# Patient Record
Sex: Female | Born: 1965
Health system: Southern US, Community
[De-identification: ages and names within clinical notes are randomized; demographics above are authoritative.]

## PROBLEM LIST (undated history)

## (undated) DIAGNOSIS — K279 Peptic ulcer, site unspecified, unspecified as acute or chronic, without hemorrhage or perforation: Secondary | ICD-10-CM

## (undated) DIAGNOSIS — I10 Essential (primary) hypertension: Secondary | ICD-10-CM

## (undated) DIAGNOSIS — K219 Gastro-esophageal reflux disease without esophagitis: Secondary | ICD-10-CM

## (undated) DIAGNOSIS — Z5189 Encounter for other specified aftercare: Secondary | ICD-10-CM

## (undated) DIAGNOSIS — D509 Iron deficiency anemia, unspecified: Principal | ICD-10-CM

## (undated) DIAGNOSIS — N63 Unspecified lump in unspecified breast: Secondary | ICD-10-CM

## (undated) DIAGNOSIS — K275 Chronic or unspecified peptic ulcer, site unspecified, with perforation: Secondary | ICD-10-CM

## (undated) HISTORY — DX: Peptic ulcer, site unspecified, unspecified as acute or chronic, without hemorrhage or perforation: K27.9

## (undated) HISTORY — DX: Iron deficiency anemia, unspecified: D50.9

## (undated) HISTORY — DX: Chronic or unspecified peptic ulcer, site unspecified, with perforation: K27.5

## (undated) HISTORY — DX: Unspecified lump in unspecified breast: N63.0

---

## 2004-06-27 HISTORY — PX: REPAIR OF PERFORATED ULCER: SHX6065

## 2004-07-08 ENCOUNTER — Inpatient Hospital Stay (HOSPITAL_COMMUNITY): Admission: EM | Admit: 2004-07-08 | Discharge: 2004-07-14 | Payer: Self-pay

## 2004-07-08 DIAGNOSIS — K275 Chronic or unspecified peptic ulcer, site unspecified, with perforation: Secondary | ICD-10-CM

## 2004-07-08 HISTORY — DX: Chronic or unspecified peptic ulcer, site unspecified, with perforation: K27.5

## 2006-06-27 DIAGNOSIS — Z5189 Encounter for other specified aftercare: Secondary | ICD-10-CM

## 2006-06-27 HISTORY — DX: Encounter for other specified aftercare: Z51.89

## 2006-11-16 IMAGING — CT CT ABDOMEN W/ CM
1 of 3 series · 14 of 32 positions shown, 19 images · IV contrast (GASTRO & OMNI 300 [ID])
Comparison: none

CLINICAL DATA: Severe centralized abdominal pain with fever and nausea. 
 CT SCAN OF THE ABDOMEN AND WITH INTRAVENOUS CONTRAST, 07/08/04:
TECHNIQUE: Scans were performed following intravenous injection of 100 cc of Omnipaque 300.
 CT ABDOMEN: 
 There is cardiomegaly.  There is some atelectasis at the left lung base.
 There is mild hepatomegaly with diffuse ascites.  There are no focal liver lesions.  The spleen, pancreas, adrenal glands, and kidneys appear normal.  The mucosa of the distal stomach appears slightly edematous.  There are no dilated loops of large or small bowel.  On a single image, there appears to be a small collection of air extrinsic to the anterior aspect of the antrum of the stomach.  This is probably air in the duodenal bulb, but I cannot exclude a tiny focal perforation.
 There is no other free air.

[Series 2: abd pelvis · axial · 0.70mm/px · z∈[-431,-51]mm · 14 of 86 slices shown, 19 images]
[im 5/86  soft-tissue]
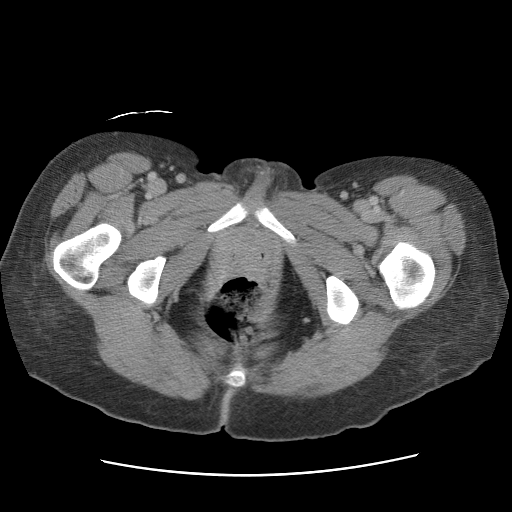
[im 5/86  bone]
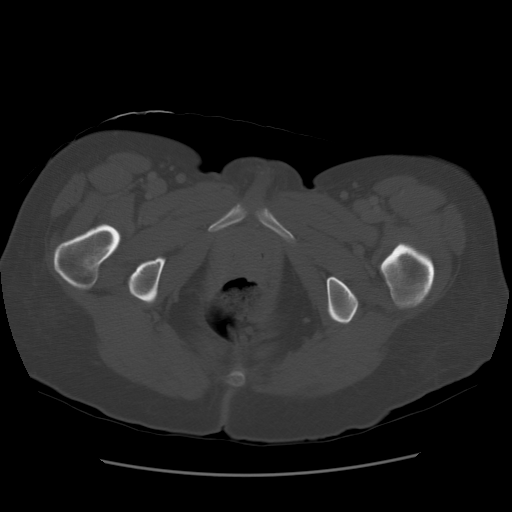
[im 10/86  soft-tissue]
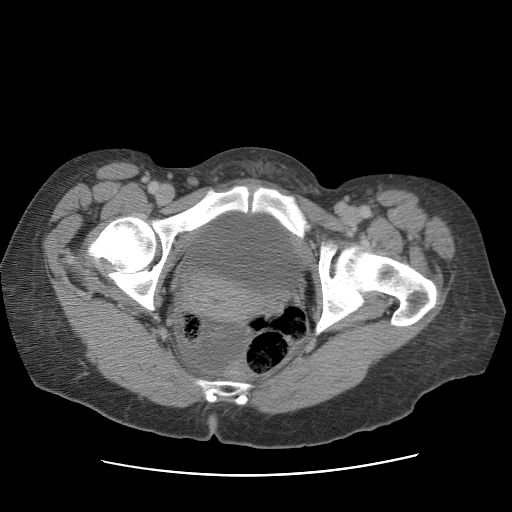
[im 19/86  soft-tissue]
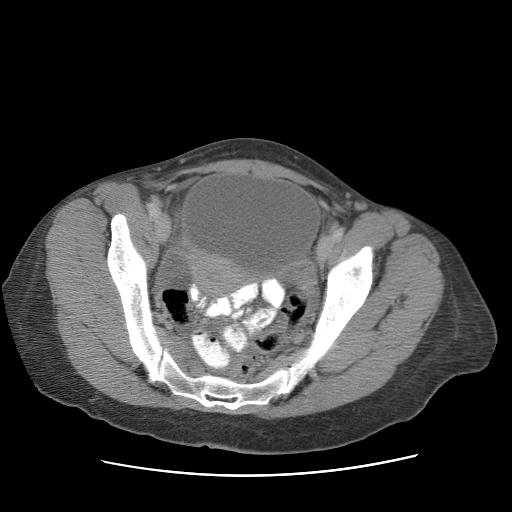
[im 24/86  soft-tissue]
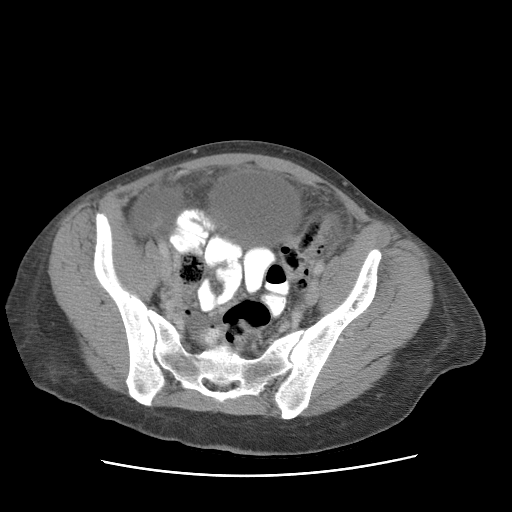
[im 29/86  soft-tissue]
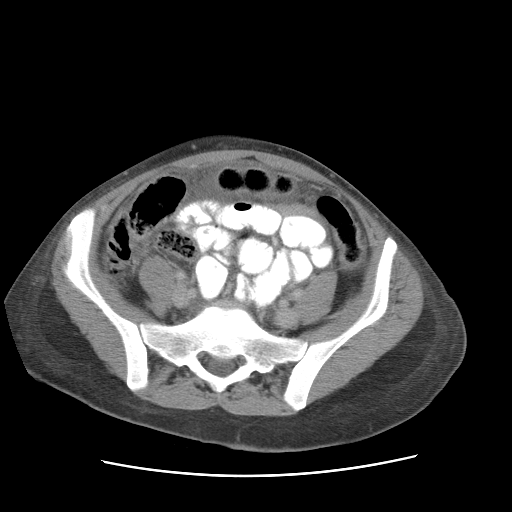
[im 38/86  soft-tissue]
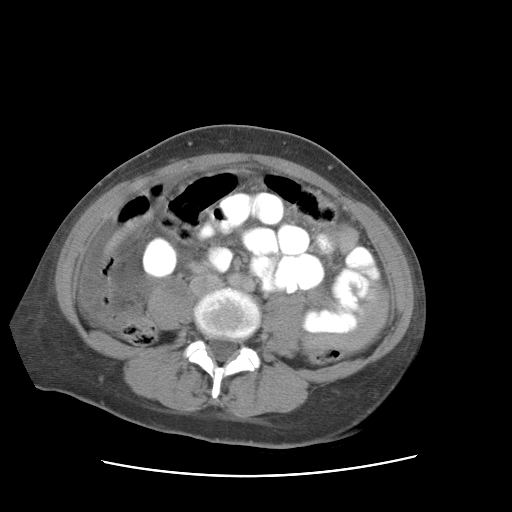
[im 43/86  soft-tissue]
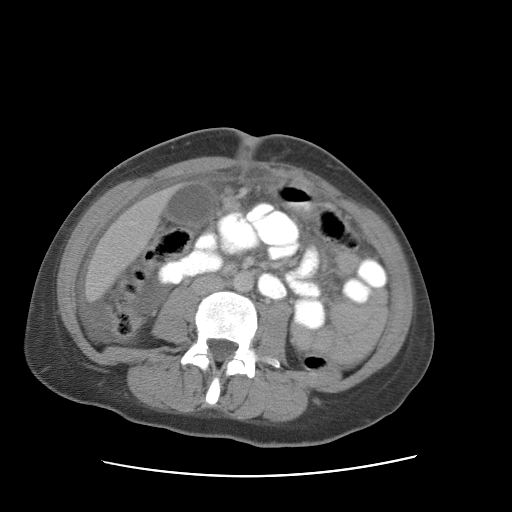
[im 48/86  soft-tissue]
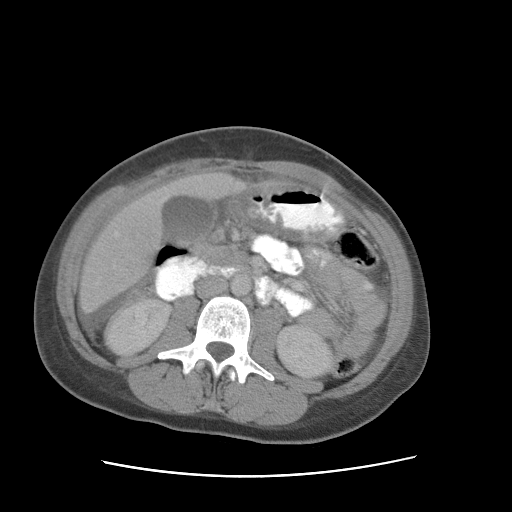
[im 57/86  soft-tissue]
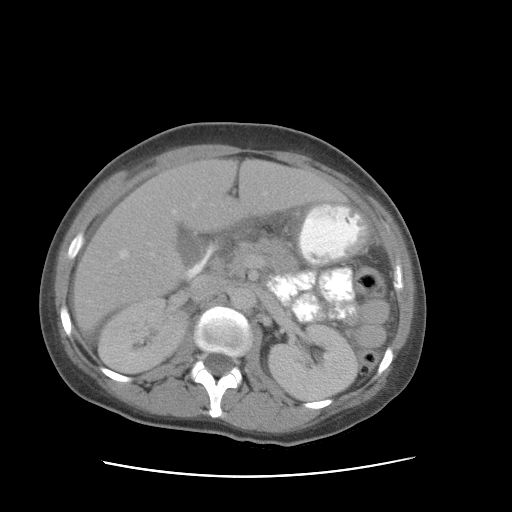
[im 57/86  bone]
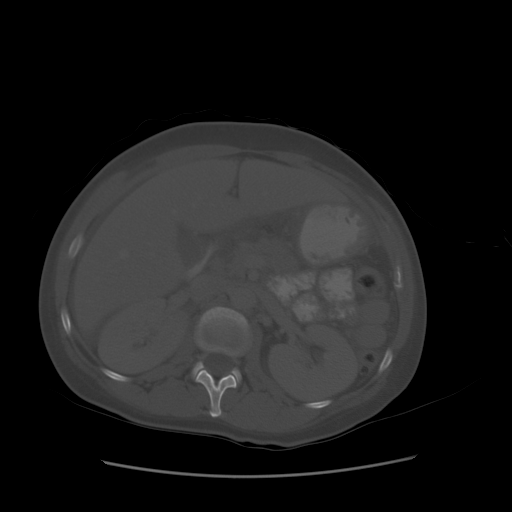
[im 62/86  soft-tissue]
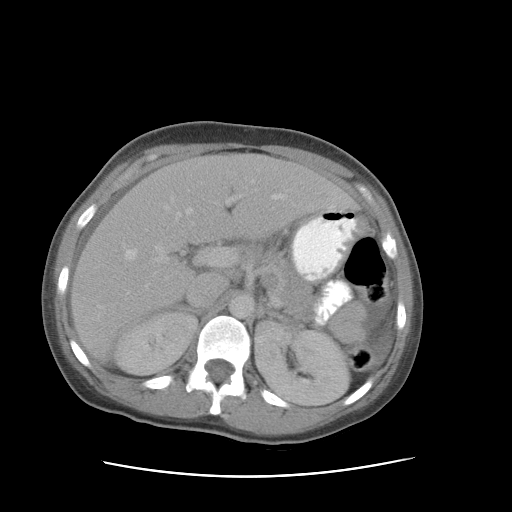
[im 67/86  soft-tissue]
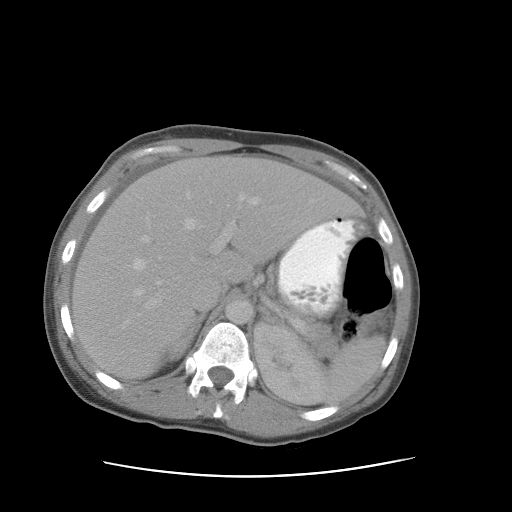
[im 67/86  lung]
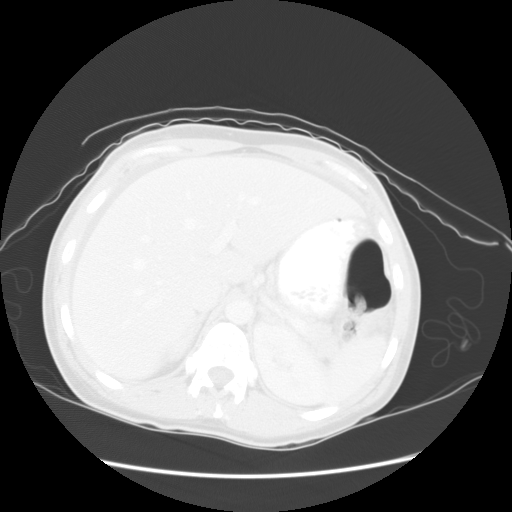
[im 71/86  lung]
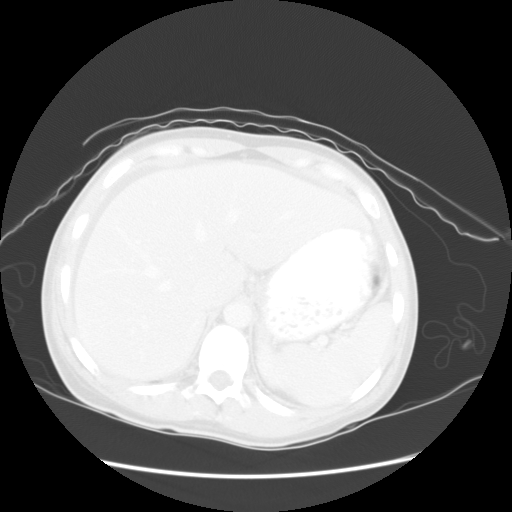
[im 76/86  soft-tissue]
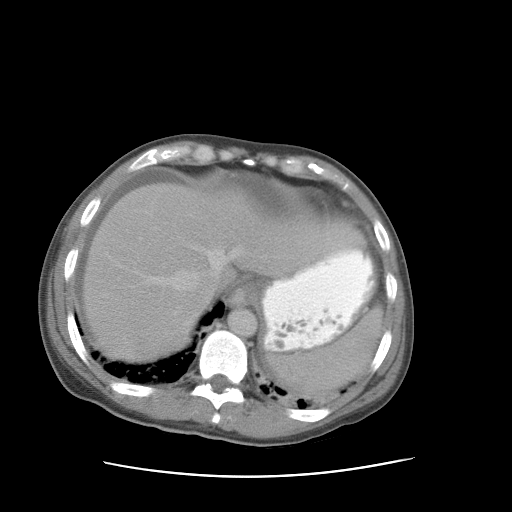
[im 76/86  lung]
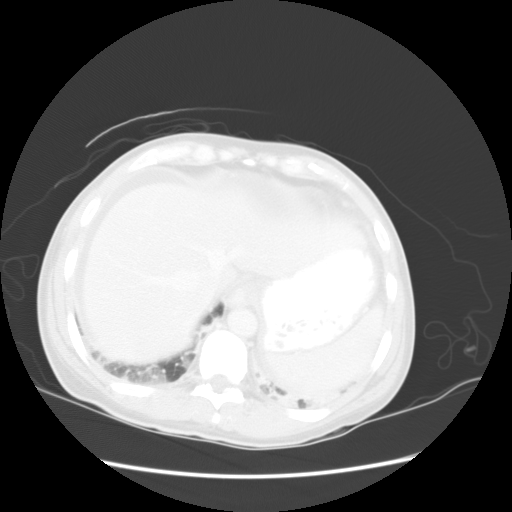
[im 81/86  soft-tissue]
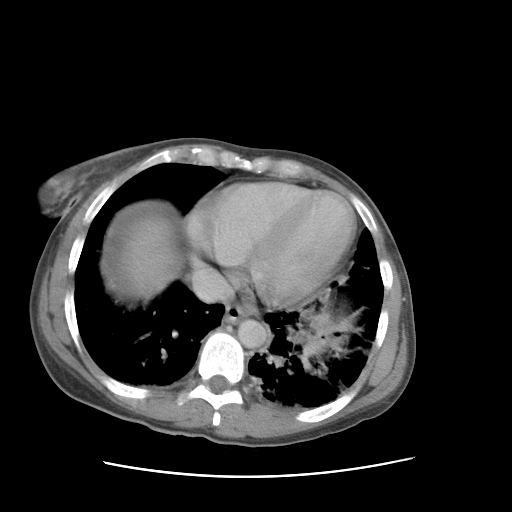
[im 81/86  lung]
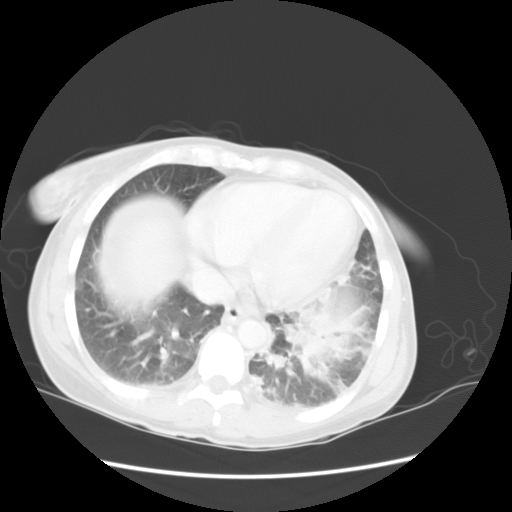

[14 of 32 positions shown; findings below may reference images not displayed]

IMPRESSION: Diffuse ascites with mild hepatomegaly.  Small air collection anterior to the antrum or duodenal bulb is probably within the bulb, but I cannot exclude a tiny focal perforation.  Edema in the mucosa of the distal antrum. 
 CT PELVIS:
 There is moderate ascites in the pelvis.  There are multiple diverticula in the sigmoid portion of the colon.  The terminal ileum is visible and appears normal.  I see what appears to be a normal appendix.  The uterus and ovaries are visible and do not appear enlarged.
IMPRESSION: Nonspecific ascites in the pelvis.  Diverticulosis in the colon.  
 The omental fat appears hazy, suggestive of peritonitis.

## 2006-11-17 ENCOUNTER — Inpatient Hospital Stay (HOSPITAL_COMMUNITY): Admission: EM | Admit: 2006-11-17 | Discharge: 2006-11-21 | Payer: Self-pay | Admitting: *Deleted

## 2006-11-17 ENCOUNTER — Ambulatory Visit: Payer: Self-pay | Admitting: Internal Medicine

## 2006-11-20 ENCOUNTER — Encounter (INDEPENDENT_AMBULATORY_CARE_PROVIDER_SITE_OTHER): Payer: Self-pay | Admitting: *Deleted

## 2006-11-20 ENCOUNTER — Encounter (INDEPENDENT_AMBULATORY_CARE_PROVIDER_SITE_OTHER): Payer: Self-pay | Admitting: Gastroenterology

## 2006-11-22 DIAGNOSIS — D509 Iron deficiency anemia, unspecified: Secondary | ICD-10-CM

## 2006-11-22 DIAGNOSIS — Z862 Personal history of diseases of the blood and blood-forming organs and certain disorders involving the immune mechanism: Secondary | ICD-10-CM

## 2006-11-22 DIAGNOSIS — D759 Disease of blood and blood-forming organs, unspecified: Secondary | ICD-10-CM | POA: Insufficient documentation

## 2006-11-22 DIAGNOSIS — Z8639 Personal history of other endocrine, nutritional and metabolic disease: Secondary | ICD-10-CM

## 2006-11-22 DIAGNOSIS — K279 Peptic ulcer, site unspecified, unspecified as acute or chronic, without hemorrhage or perforation: Secondary | ICD-10-CM

## 2006-11-22 DIAGNOSIS — Z8711 Personal history of peptic ulcer disease: Secondary | ICD-10-CM

## 2006-11-22 HISTORY — DX: Peptic ulcer, site unspecified, unspecified as acute or chronic, without hemorrhage or perforation: K27.9

## 2006-11-22 HISTORY — DX: Iron deficiency anemia, unspecified: D50.9

## 2006-11-28 ENCOUNTER — Encounter (INDEPENDENT_AMBULATORY_CARE_PROVIDER_SITE_OTHER): Payer: Self-pay | Admitting: *Deleted

## 2006-11-28 ENCOUNTER — Ambulatory Visit: Payer: Self-pay | Admitting: Internal Medicine

## 2006-11-28 LAB — CONVERTED CEMR LAB
Basophils Absolute: 0 10*3/uL (ref 0.0–0.1)
Basophils Relative: 0 % (ref 0–1)
Calcium: 9.5 mg/dL (ref 8.4–10.5)
Eosinophils Absolute: 0.4 10*3/uL (ref 0.0–0.7)
Lymphs Abs: 1.5 10*3/uL (ref 0.7–3.3)
MCV: 86.6 fL (ref 78.0–100.0)
Neutrophils Relative %: 56 % (ref 43–77)
RDW: 34.2 % — ABNORMAL HIGH (ref 11.5–14.0)
Sodium: 140 meq/L (ref 135–145)
WBC: 6.3 10*3/uL (ref 4.0–10.5)

## 2007-02-28 ENCOUNTER — Ambulatory Visit: Payer: Self-pay | Admitting: Internal Medicine

## 2007-02-28 ENCOUNTER — Encounter (INDEPENDENT_AMBULATORY_CARE_PROVIDER_SITE_OTHER): Payer: Self-pay | Admitting: Internal Medicine

## 2007-02-28 LAB — CONVERTED CEMR LAB
BUN: 10 mg/dL (ref 6–23)
CO2: 23 meq/L (ref 19–32)
Chloride: 108 meq/L (ref 96–112)
Creatinine, Ser: 0.5 mg/dL (ref 0.40–1.20)
HCT: 42.7 % (ref 36.0–46.0)
Hemoglobin: 14.3 g/dL (ref 12.0–15.0)
Potassium: 4.5 meq/L (ref 3.5–5.3)
RBC: 4.36 M/uL (ref 3.87–5.11)
WBC: 7.1 10*3/uL (ref 4.0–10.5)

## 2007-06-27 ENCOUNTER — Ambulatory Visit: Payer: Self-pay | Admitting: Hospitalist

## 2007-06-27 ENCOUNTER — Encounter (INDEPENDENT_AMBULATORY_CARE_PROVIDER_SITE_OTHER): Payer: Self-pay | Admitting: Internal Medicine

## 2007-06-27 LAB — CONVERTED CEMR LAB
HCT: 43 % (ref 36.0–46.0)
MCHC: 34 g/dL (ref 30.0–36.0)
MCV: 94.3 fL (ref 78.0–100.0)
Platelets: 360 10*3/uL (ref 150–400)

## 2007-07-14 ENCOUNTER — Emergency Department (HOSPITAL_COMMUNITY): Admission: EM | Admit: 2007-07-14 | Discharge: 2007-07-14 | Payer: Self-pay | Admitting: Emergency Medicine

## 2007-07-16 ENCOUNTER — Ambulatory Visit: Payer: Self-pay | Admitting: Internal Medicine

## 2007-07-16 ENCOUNTER — Encounter (INDEPENDENT_AMBULATORY_CARE_PROVIDER_SITE_OTHER): Payer: Self-pay | Admitting: *Deleted

## 2007-07-16 ENCOUNTER — Telehealth: Payer: Self-pay | Admitting: *Deleted

## 2007-07-16 DIAGNOSIS — A048 Other specified bacterial intestinal infections: Secondary | ICD-10-CM | POA: Insufficient documentation

## 2007-07-16 DIAGNOSIS — R1013 Epigastric pain: Secondary | ICD-10-CM | POA: Insufficient documentation

## 2007-07-16 DIAGNOSIS — N390 Urinary tract infection, site not specified: Secondary | ICD-10-CM

## 2007-07-16 LAB — CONVERTED CEMR LAB
ALT: 16 units/L (ref 0–35)
BUN: 12 mg/dL (ref 6–23)
Basophils Absolute: 0.1 10*3/uL (ref 0.0–0.1)
CO2: 28 meq/L (ref 19–32)
Calcium: 9 mg/dL (ref 8.4–10.5)
Chloride: 103 meq/L (ref 96–112)
Creatinine, Ser: 0.55 mg/dL (ref 0.40–1.20)
Eosinophils Relative: 4 % (ref 0–5)
HCT: 42.9 % (ref 36.0–46.0)
Hemoglobin: 14.4 g/dL (ref 12.0–15.0)
Lymphocytes Relative: 25 % (ref 12–46)
MCHC: 33.6 g/dL (ref 30.0–36.0)
Magnesium: 2.1 mg/dL (ref 1.5–2.5)
Microalb Creat Ratio: 8.3 mg/g (ref 0.0–30.0)
Monocytes Absolute: 0.9 10*3/uL (ref 0.1–1.0)
Monocytes Relative: 10 % (ref 3–12)
Nitrite: NEGATIVE
RBC: 4.47 M/uL (ref 3.87–5.11)
RDW: 13.1 % (ref 11.5–15.5)
Specific Gravity, Urine: 1.025 (ref 1.005–1.03)
TSH: 0.674 microintl units/mL (ref 0.350–5.50)
Total Bilirubin: 0.3 mg/dL (ref 0.3–1.2)
Urobilinogen, UA: 2 — ABNORMAL HIGH (ref 0.0–1.0)
pH: 7 (ref 5.0–8.0)

## 2007-07-17 ENCOUNTER — Encounter (INDEPENDENT_AMBULATORY_CARE_PROVIDER_SITE_OTHER): Payer: Self-pay | Admitting: *Deleted

## 2007-07-22 LAB — CONVERTED CEMR LAB: OCCULT 2: NEGATIVE

## 2007-07-23 ENCOUNTER — Encounter (INDEPENDENT_AMBULATORY_CARE_PROVIDER_SITE_OTHER): Payer: Self-pay | Admitting: Internal Medicine

## 2007-07-23 ENCOUNTER — Ambulatory Visit: Payer: Self-pay | Admitting: Internal Medicine

## 2007-07-23 DIAGNOSIS — I10 Essential (primary) hypertension: Secondary | ICD-10-CM | POA: Insufficient documentation

## 2007-07-23 LAB — CONVERTED CEMR LAB
CO2: 23 meq/L (ref 19–32)
Calcium: 9.3 mg/dL (ref 8.4–10.5)
Chloride: 104 meq/L (ref 96–112)
Creatinine, Ser: 0.5 mg/dL (ref 0.40–1.20)
Glucose, Bld: 99 mg/dL (ref 70–99)

## 2007-07-28 ENCOUNTER — Encounter (INDEPENDENT_AMBULATORY_CARE_PROVIDER_SITE_OTHER): Payer: Self-pay | Admitting: Internal Medicine

## 2007-08-06 ENCOUNTER — Encounter (INDEPENDENT_AMBULATORY_CARE_PROVIDER_SITE_OTHER): Payer: Self-pay | Admitting: Internal Medicine

## 2007-08-06 ENCOUNTER — Ambulatory Visit: Payer: Self-pay | Admitting: Internal Medicine

## 2007-08-06 LAB — CONVERTED CEMR LAB
CO2: 25 meq/L (ref 19–32)
Calcium: 9.5 mg/dL (ref 8.4–10.5)
Chloride: 103 meq/L (ref 96–112)

## 2007-08-16 ENCOUNTER — Ambulatory Visit: Payer: Self-pay | Admitting: Internal Medicine

## 2007-08-16 DIAGNOSIS — N61 Mastitis without abscess: Secondary | ICD-10-CM | POA: Insufficient documentation

## 2007-08-31 ENCOUNTER — Encounter (INDEPENDENT_AMBULATORY_CARE_PROVIDER_SITE_OTHER): Payer: Self-pay | Admitting: *Deleted

## 2007-08-31 ENCOUNTER — Ambulatory Visit: Payer: Self-pay | Admitting: Infectious Diseases

## 2007-08-31 DIAGNOSIS — N63 Unspecified lump in unspecified breast: Secondary | ICD-10-CM

## 2007-08-31 HISTORY — DX: Unspecified lump in unspecified breast: N63.0

## 2007-08-31 LAB — CONVERTED CEMR LAB
BUN: 20 mg/dL
CO2: 19 meq/L
Calcium: 9.2 mg/dL
Chloride: 105 meq/L
Creatinine, Ser: 0.64 mg/dL
Glucose, Bld: 88 mg/dL
Potassium: 3.9 meq/L
Sodium: 142 meq/L

## 2007-09-06 ENCOUNTER — Encounter: Admission: RE | Admit: 2007-09-06 | Discharge: 2007-09-06 | Payer: Self-pay | Admitting: *Deleted

## 2009-03-27 IMAGING — CR DG ABDOMEN ACUTE W/ 1V CHEST
3 series · 3 of 3 positions shown · non-contrast
Comparison: 07/08/2004.

Exam: Abdomen acute with chest. Three views

HISTORY: Anemia and abdominal pain.

[w chest pa]
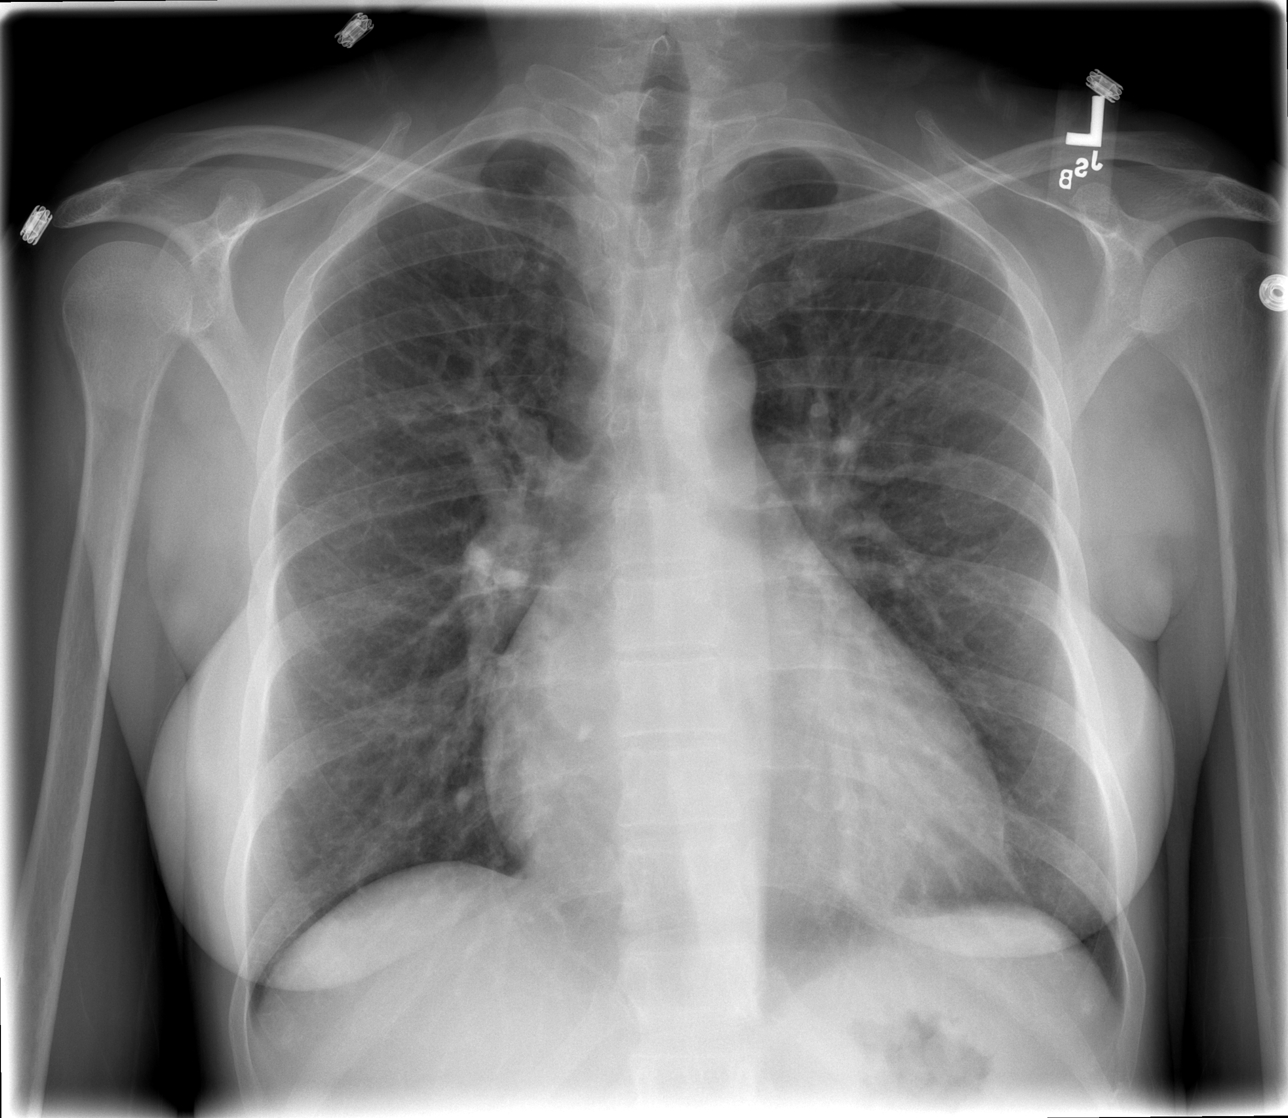

[w abdomen upright]
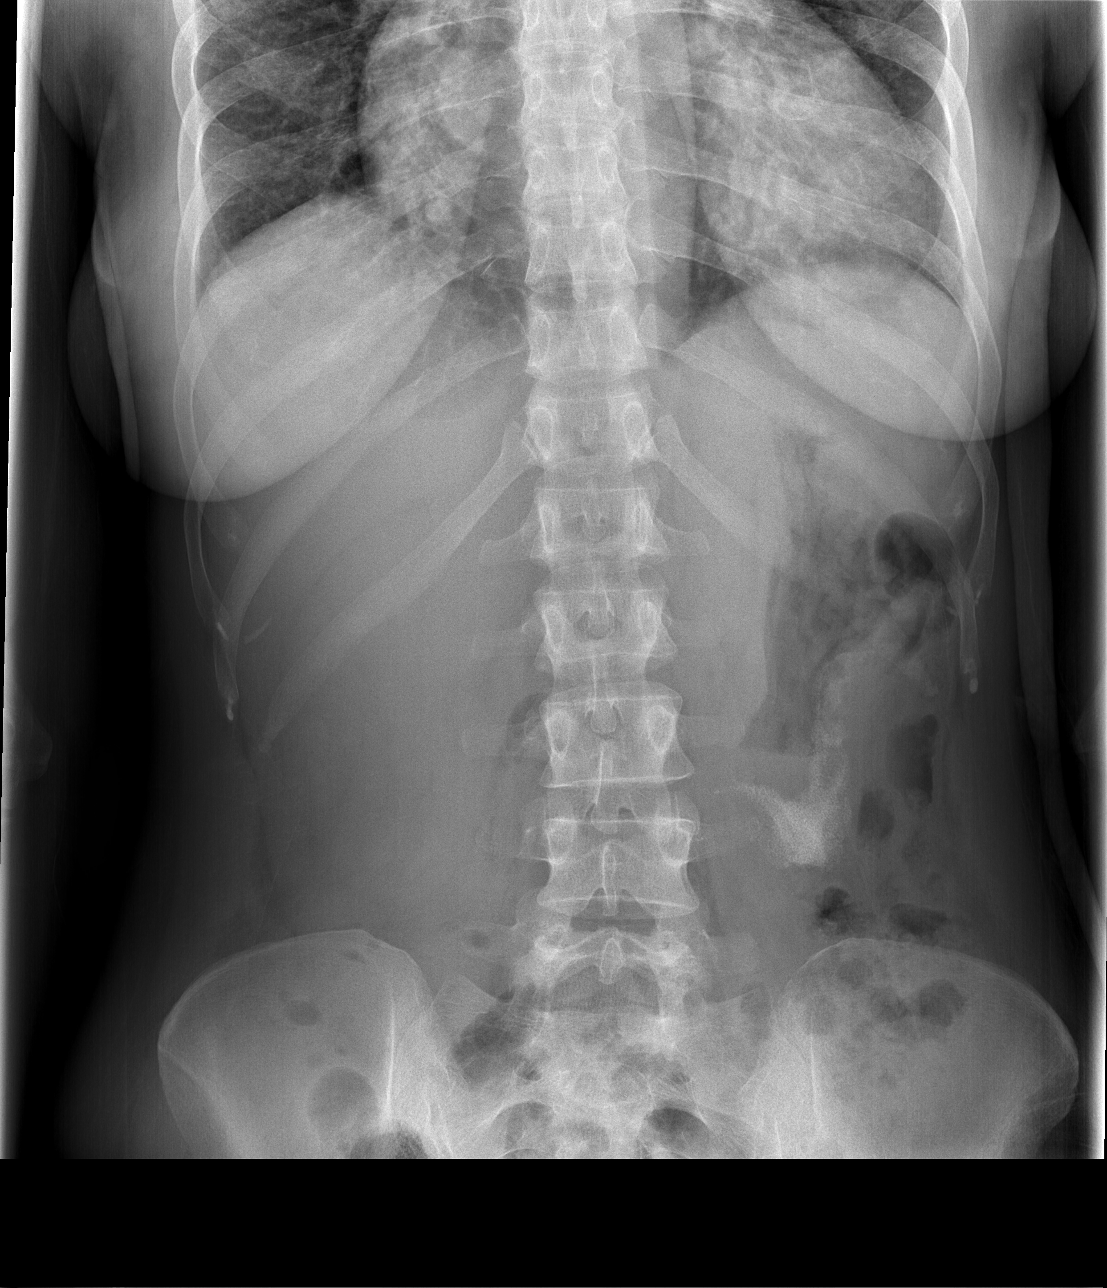

[t abdomen supine]
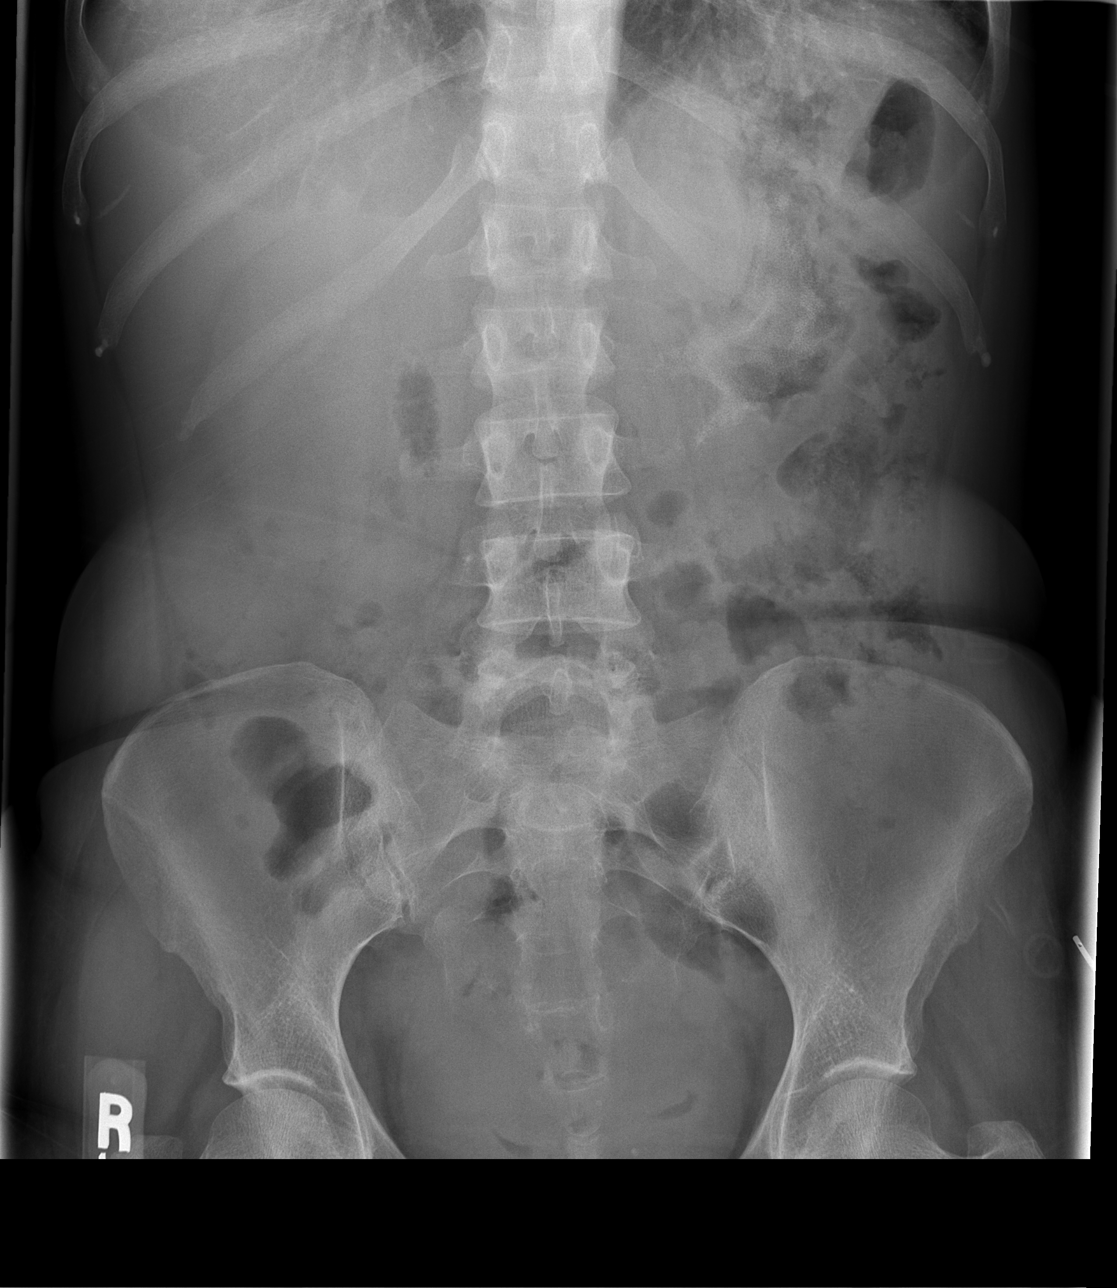

[3 of 3 positions shown; findings below may reference images not displayed]

FINDINGS: Heart size is enlarged.

No effusion or edema.

There is no airspace opacities.

The bowel gas pattern is nonobstructive.

There is a radiodensity in the projection of the stomach. This is of uncertain
significance. Correlation with any medications the patient has recently taken
recommended.
IMPRESSION: 1. Cardiac enlargement without heart failure.
2. Nonobstructive bowel gas pattern.
3. Radiodensity in the projection of the stomach of uncertain significance.
Careful clinical correlation recommended.

## 2010-11-09 NOTE — Op Note (Signed)
[name], BARRELL               ACCOUNT NO.:  [id number]   MEDICAL RECORD NO.:  [id number]          PATIENT TYPE:  INP   LOCATION:  6741                         FACILITY:  [facility]   PHYSICIAN:  Adler Friar, MDDATE OF BIRTH:  [birth year]   DATE OF PROCEDURE:  11/21/2006  DATE OF DISCHARGE:  11/21/2006                               OPERATIVE REPORT   PROCEDURE PERFORMED:  Capsule endoscopy.   INDICATIONS FOR PROCEDURE:  Anemia.   FINDINGS:  1. Multiple distal small bowel nonbleeding angioectasias.  2. Gastric ulcer.  3. Scattered erosions.   PLAN:  1. Iron supplementation.  2. Avoid NSAIDs.      [name], MD  Electronically Signed     VCS/MEDQ  D:  12/21/2006  T:  12/22/2006  Job:  [id number]

## 2010-11-09 NOTE — Op Note (Signed)
[name], [name]               ACCOUNT NO.:  [id number]   MEDICAL RECORD NO.:  [id number]          PATIENT TYPE:  INP   LOCATION:  3310                         FACILITY:  [facility]   PHYSICIAN:  Ame Friar, MDDATE OF BIRTH:  12-May-1966   DATE OF PROCEDURE:  DATE OF DISCHARGE:                               OPERATIVE REPORT   PROCEDURE:  Colonoscopy.   INDICATIONS:  Anemia.   MEDICATIONS:  Fentanyl 125 mcg IV, Versed 12.5 mg IV.   FINDINGS:  The endoscope was inserted into a well-prepped colon and  advanced to the cecum where the ileocecal valve and appendiceal orifice  were identified.  In the cecum were a few nonbleeding arteriovenous  malformations.  The terminal ileum was intubated, and scattered areas of  nonbleeding AVMs were also noted there.  On careful withdrawal of the  colonoscope, no blood products were seen.  An 6-mm rectosigmoid sessile  polyp was noted, and this was removed with snare cautery.  Retroflexion  revealed hypertrophied anal papillae.  No other mucosal abnormalities  were noted.   ASSESSMENT:  1. 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  2. Scattered left-sided diverticulosis.  3. A 6-mm rectosigmoid polyp removed with snare cautery.   PLAN:  1. Suspect AVMs contributing to her severe anemia.  2. Plan to do capsule endoscopy to further evaluate small intestine.  3. Continue IV iron.  4. Follow up on pathology.      Lucianne Friar, MD  Electronically Signed     VCS/MEDQ  D:  11/20/2006  T:  11/20/2006  Job:  [id number]

## 2010-11-09 NOTE — Discharge Summary (Signed)
[name], [name]               ACCOUNT NO.:  [id number]   MEDICAL RECORD NO.:  [id number]          PATIENT TYPE:  INP   LOCATION:  6741                         FACILITY:  [facility]   PHYSICIAN:  Duncan Dull, M.D.     DATE OF BIRTH:  June 28, 1965   DATE OF ADMISSION:  11/17/2006  DATE OF DISCHARGE:  11/21/2006                               DISCHARGE SUMMARY   DISCHARGE DIAGNOSES:  1. Anemia of acute blood loss, severe, secondary to gastrointestinal      bleed.  2. Gastrointestinal bleed, etiology unclear.  3. 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  4. Reactive thrombocytosis secondary to severe anemia.   PROCEDURES:  EGD by Dr. Bosie Clos:  large, shallow clean-based ulcer at  distal stomach.  No active bleeding upon insertion, no source of anemia  found and small hiatal hernia.  He also had colonoscopy that showed  diffuse or scattered left-sided diverticulosis, scattered non-bleeding  arteriovenous malformation in cecum and terminal ileum, and a 6-mm  rectosigmoid polyp removed with snare cautery, the pathology of which  returned as hyperplastic polyp, no adenomatous change or malignancy  identified.  Capsule endoscopy on May 27 of 2008, results of which are  still pending.   CONDITION AT DISCHARGE:  Improved.  Anemia greatly improved.  No  abdominal pain.   LIST OF MEDICATIONS AT DISCHARGE:  1. Ferrous sulfate 325 mg p.o. b.i.d.  2. Folic acid 1 mg p.o. daily for 1 week.  3. Multivitamins 1 tablet p.o. daily.  4. Protonix 40 mg p.o. b.i.d. for 6 weeks and then continue with 40 mg      p.o. daily.  5. The patient was advised not to take any NSAIDs, aspirin, Alka-      Seltzer, Goody Powders, ibuprofen, etc., and she will have an      appointment with Dr. Michael Boston in the outpatient clinic on November 28, 2006, at 1:30 p.m.  At that time, Dr. Michael Boston will need to check     her CBC, to check on her hemoglobin, and also to check her BMET to      follow potassium level.   HISTORY OF PRESENT ILLNESS:  [name] is a 45 year old white woman  with history of perforated peptic ulcer secondary to NSAIDs s/p post  repair in 2006, subsequent chronic abdominal pain with continued NSAID  use which became acutely worse over the last few days prior to  admission.  The pain has  associated with nausea, anorexia and nonbloody  emesis.  She also reports extreme fatigue for the past couple of months,  and her friends and family have told her that she looks more pale.  She  also had dyspnea on exertion for the past couple of months but denies  chest pain, shortness of breath at rest, blood from any orifices, weight  loss or weight gain, and urinary complaint.   She has no known drug allergies.   VITAL SIGNS ON ADMISSION:  Temperature 98.1, blood pressure 141/80,  pulse 99, respiration rate 16, oxygen saturation 100% on room air.  GENERAL EXAM:  She was alert and oriented x3, very pale.  HEENT:  Eyes:  Extraocular movements intact.  PERRLA.  Sclerae were very  white, and she had pale conjunctivae.  ENT:  Oropharynx clear.  Very  poor dentition.  NECK:  Supple, no lymphadenopathy.  RESPIRATORY:  Clear to auscultation bilaterally.  CARDIOVASCULAR:  Regular rate and rhythm.  Very prominent heart sounds  heard in the axilla and bilateral carotids as well and 2/6 systolic  ejection murmur.  GI:  Abdomen was soft.  Positive bowel sounds, nondistended but tender  to palpation in the left upper quadrant with mild guarding.  GU:  She had no CVA tenderness.  RECTAL EXAM:  She had brown, soft stool in vault.  Heme positive,  without gross blood.  SKIN:  Dry, flaky, and warm.  LYMPH:  She had no lymphadenopathy.  NEURO EXAM:  Nonfocal.  PSYCH:  Appropriate.   LABORATORY:  White blood count 7.9 with an ANC of 4.6, hemoglobin 2.8,  RBC 1.53, RDW 34.5, thrombocytes 1,210,000,  MCV 61.3.  Urinalysis was  negative.  Her urine pregnancy test was negative, and Hemoccult was  positive.  ESR 40, PT was 15.4, INR was 1.2, PTT 27, fibrinogen 348,  magnesium was 2.2.  Sodium 138, potassium 2.9, chloride 109, bicarb 22,  BUN 3, creatinine 0.38, glucose 108, bilirubin 0.4, alk phos 86, AST 12,  ALT 12, protein 5.9, albumin 3.2, calcium 8.5, lipase 19.Cardiac enzymes  were normal x1.  TSH was 1.83, iron was less than 10, TIBC and percent  saturation were not calculated.  B12 was 752, folate in serum was 10.4,  folate in RBC was 790, and ferritin was less than 1.  She had a C-  reactive protein that was low at 0.3, and the endomysial antibody was  less than 1 to 10.  Helicobacter pylori IgG in serum was 2.6, which is  high.   ASSESSMENT AND PLAN:  1. GI Bleed.  Patient was admitted to Step Down Unit for transfusion      and stabilization pending gastrointestinal evaluation by Dr.      Bosie Clos, who performed an endoscopy that found the old scar of her      previous ulcer that was healed and nonbleeding.  He also performed      a colonoscopy that found several AVMs (arteriovenous      malformations), which could have been the source of her bleeding.      At the time of this dictation, she has a capsule endoscopy pending.  2. Anemia, severe, secondary to acute on chronic blood loss.  Patient      was transfused 4 units  with an appropriate response to the      transfusion. She also received  3 days of InFED infusion.  Her      hemoglobin at discharge was 9.1.  The patient was discharged on      p.o. iron supplements, and week's worth of folic acid.  She will      have an appointment in the outpatient clinic, and at that time      needs a repeat CBC, and she will also need to have another set of      iron studies in the future.  She was advised against using      nonsteroidal anti-inflammatory drugs. 3. Reactive thrombocytosis:  The patient arrived with  thrombocyte of       1.2 million, and they decreased during this admission, along with      the resolution of her anemia, and prior to discharge, they were      543,000.  4. Hypokalemia:  She arrived with a potassium of 2.9, probably      secondary to the blood loss, and we have repeated her potassium,      and at discharge, it was 3.4 and was repleted.  She will have an      appointment in the outpatient clinic, and at that time, her      potassium needs to be rechecked.   LABORATORY AT DISCHARGE:  White blood count 9.8, hemoglobin 9.1,  hematocrit 28.9, MCV 79.7, platelet count 543.  Sodium 139, potassium  3.4, chloride 108, CO2 of 21, glucose 98, BUN less than 1, creatinine  0.4,  calcium 8.9, magnesium 2.2.  A set of blood cultures are negative x1,  and she grew Staphylococcus species, coag-negative from one of the blood  cultures; however, the patient was afebrile with no blood counts, so  most likely this was a contaminant.      Carlus Pavlov, M.D.  Electronically Signed      Duncan Dull, M.D.  Electronically Signed    CG/MEDQ  D:  11/24/2006  T:  11/24/2006  Job:  [id number]   cc:   Rhianne Friar, MD  Thereasa Solo, M.D.

## 2010-11-09 NOTE — Op Note (Signed)
[name], [name]               ACCOUNT NO.:  [id number]   MEDICAL RECORD NO.:  [id number]          PATIENT TYPE:  INP   LOCATION:  3310                         FACILITY:  [facility]   PHYSICIAN:  Alonnah [name], MDDATE OF BIRTH:  1965/12/30   DATE OF PROCEDURE:  11/18/2006  DATE OF DISCHARGE:                               OPERATIVE REPORT   PROCEDURE:  Upper endoscopy.   INDICATIONS:  Anemia, guaiac positive stool, history of perforated  ulcer.   MEDICATIONS:  Fentanyl 75 mcg IV, Versed 6 mg IV.   FINDINGS:  Endoscope was inserted through the oropharynx, esophagus was  intubated which was normal in its entirety.  Endoscope was advanced into  the stomach where no blood was seen.  In the distal portion of the  stomach, a large, shallow, clean-based ulcer was noted near the site of  previous gastric repair.  The folds were contracted in appearance,  consistent with previous surgery.  Retroflexion was done which revealed  small hiatal hernia but otherwise normal proximal stomach.  Endoscope  was carefully advanced through the pyloric channel into the duodenal  bulb which was normal in appearance.  It was then advanced into the  second portion of duodenum which revealed clear bilious fluid without  any abnormalities.  Endoscope was withdrawn back into the stomach where  a small amount of oozing was noted at the edge of the ulcer, consistent  with scope trauma.  This bleeding spontaneously subsided.  Endoscope was  then withdrawn to confirm the above findings.   ASSESSMENT:  1. Large, shallow, clean-based ulcer at distal stomach.  2. No active bleeding upon insertion.  3. No source of anemia found.  4. Small hiatal hernia.   PLAN:  1. Continue continuous infusion of Protonix 8 mg an hour for 48 hours      and then change to q.12h.  2. Check H pylori serology and treat if positive.  3. Clear liquid diet.  4. Avoid NSAIDs.  5. Guaiac stools.  6. Plan for colonoscopy on Nov 20, 2006.  7. Continue transfusion of blood products and agree checking of      peripheral smear for other sources of anemia.     Tara Friar, MD  Electronically Signed    VCS/MEDQ  D:  11/18/2006  T:  11/18/2006  Job:  [id number]   cc:   Ollen Gross. Vernell Morgans, M.D.

## 2010-11-09 NOTE — Consult Note (Signed)
NAMECHRISTYN, [name]               ACCOUNT NO.:  [id number]   MEDICAL RECORD NO.:  [id number]          PATIENT TYPE:  INP   LOCATION:  1855                         FACILITY:  [facility]   PHYSICIAN:  Juleen Friar, MDDATE OF BIRTH:  1965/10/18   DATE OF CONSULTATION:  11/17/2006  DATE OF DISCHARGE:                                 CONSULTATION   REQUESTING PHYSICIAN:  Dr. Darrick Huntsman   INDICATION:  Anemia.   HISTORY OF PRESENT ILLNESS:  [name] is a 45 year old white female  with history of perforated peptic ulcer requiring surgical repair in  2006.  She presents with several weeks of weakness and was found to have  severe anemia with a hemoglobin of 2.8.  She reports this past Tuesday  she started vomiting several times and has had intermittent left upper  quadrant abdominal pain.  She was found to be heme-positive in the  emergency room but denies any black stools, bright red blood per rectum  or hematemesis.  She has been using NSAIDs in the form of Advil two  times a day for the past week and was using Alka-Seltzer weekly several  times a week before that.  She denies any lightheadedness or dizziness.   PAST MEDICAL HISTORY:  History of peptic ulcer disease with perforation  in 2006 as stated above.   MEDICATIONS:  Prilosec p.r.n.   ALLERGIES:  No known drug allergies.   FAMILY HISTORY:  Noncontributory.   SOCIAL HISTORY:  Positive smoker, denies alcohol.   REVIEW OF SYSTEMS:  Negative except as stated above.   PHYSICAL EXAMINATION:  VITAL SIGNS:  Temperature 98.1, pulse 99-109,  blood pressure 141-163 over 80-119, O2 saturation 100% on room air.  GENERAL:  Lethargic, pale no acute distress, otherwise no acute  distress.  HEART:  Regular rate and rhythm.  CHEST:  Clear to auscultation bilaterally.  ABDOMEN:  Left upper quadrant tenderness with guarding; otherwise ,  nontender, soft, nondistended, positive bowel sounds.   IMPRESSION:  A 45 year old white female with  severe anemia and  hemoglobin of 2.8, along with intermittent left upper quadrant abdominal  pain and vomiting.  No visible signs of bleeding other than being trace  heme-positive in the emergency room.  She does have a history of peptic  ulcer disease with perforation, and due to this abdominal pain have  ordered an acute abdominal series to rule out free air.  This x-ray was  negative for free air but did show a radiodensity in the area of her  stomach, likely from her previous surgery.  Will need to do an upper  endoscopy to further evaluate her chronic anemia in the setting of  previous peptic ulcer disease.  I see no acute indication to do it  emergently, but will plan to do it on Nov 18, 2006.  In the meantime,  she should continue receive IV fluids and  blood transfusions as needed.  If her upper endoscopy is negative for a  source of her anemia, then she will likely need to have a colonoscopy  during this hospitalization.  I discussed the risks and  benefits  regarding an upper endoscopy and she agrees to proceed.      Carla Friar, MD  Electronically Signed     VCS/MEDQ  D:  11/17/2006  T:  11/18/2006  Job:  [id number]

## 2010-11-12 NOTE — Op Note (Signed)
[name], MYSLIWIEC               ACCOUNT NO.:  [id number]   MEDICAL RECORD NO.:  [id number]          PATIENT TYPE:  INP   LOCATION:  5738                         FACILITY:  [facility]   PHYSICIAN:  Ollen Gross. Vernell Morgans, M.D. DATE OF BIRTH:  Aug 25, 1965   DATE OF PROCEDURE:  07/08/2004  DATE OF DISCHARGE:                                 OPERATIVE REPORT   PREOPERATIVE DIAGNOSIS:  Peritonitis.   POSTOPERATIVE DIAGNOSIS:  Prepyloric gastric perforation.   PROCEDURES:  Exploratory laparotomy, Graham patch repair of gastric  perforation.   SURGEON:  Dr. Carolynne Edouard.   ANESTHESIA:  General endotracheal.   PROCEDURE:  After informed consent was obtained, the patient was brought to  the operating room and placed in the supine position on the operating room  table.  After adequate induction of general anesthesia, the patient's  abdomen was prepped with Betadine and draped in the usual sterile manner.  An upper midline incision was made with a 10 blade knife.  This incision was  carried down through the skin and subcutaneous tissue sharply with the  electrocautery until the linea alba was identified.  The linea alba was also  incised with the electrocautery and the preperitoneal space was probed  bluntly with a hemostat until the peritoneum was opened and access was  gained to the abdominal cavity.  The rest of the incision was opened under  direct vision with the electrocautery.  There was a significant amount of  purulent material identified.  This was cultured.  The abdomen was then  inspected.  Most of the purulence was focused in the upper abdomen.  The  stomach was inspected and there was a small opening on the anterior stomach  wall in the prepyloric region consistent with a perforation.  A tongue of  omentum was chosen that would easily reach this area.  The perforation was  then repaired by placing 2-0 silk stitches through the opening.  The tongue  of omentum was then placed across the opening  and all of the stitches were  synched down and tied.  The abdomen was then irrigated in all four quadrants  with copious amounts of saline.  The small bowel was run and no other  abnormalities were noted.  The colon was also visualized and appeared to be  normal.  The NG tube was palpated to be in good position in the stomach.  At  this point, the fascia of the anterior abdominal wall was then closed with  two running #1 PDS sutures.  The subcutaneous tissue was irrigated with  copious amounts of saline and Betadine and the skin was closed with staples.  Sterile dressings were applied.  The patient tolerated the procedure well.  At the end of the case, all needle and sponge and instruments were correct.  The patient was then awakened and taken to the recovery room in stable  condition.      PST/MEDQ  D:  07/11/2004  T:  07/11/2004  Job:  [id number]

## 2010-11-12 NOTE — H&P (Signed)
[name], [name]               ACCOUNT NO.:  [id number]   MEDICAL RECORD NO.:  [id number]          PATIENT TYPE:  INP   LOCATION:  1824                         FACILITY:  [facility]   PHYSICIAN:  Ollen Gross. Vernell Morgans, M.D. DATE OF BIRTH:  [birth year]   DATE OF ADMISSION:  07/07/2004  DATE OF DISCHARGE:                                HISTORY & PHYSICAL   HISTORY OF THE PRESENT ILLNESS:  [name] is a 45 year old white female who  presents tonight with abdominal pain that started acutely this morning.  At  the time the pain doubled her over and the pain worsened throughout the day.  She has had some nausea and vomiting associated with this.  She denies any  fevers.  She does state that she has had an upset stomach for two to three  weeks and has been taking a lot of Alka-Seltzers and Goody Powders for this.  She has been having normal bowel movements, but had no bowel movement today.  She otherwise denies any diarrhea, dysuria, chest pain or shortness of  breath.   REVIEW OF SYSTEMS:  The rest of her review of systems is unremarkable.   PAST MEDICAL HISTORY:  The patient's past medical history is significant for  anemia.   PAST SURGICAL HISTORY:  None.   MEDICATIONS:  None.   ALLERGIES:  No known drug allergies.   SOCIAL HISTORY:  The patient smokes about a pack of cigarettes a day and  denies any alcohol use.   FAMILY HISTORY:  The patient's family history is noncontributory.   PHYSICAL EXAMINATION:  VITAL SIGNS:  The patient's temp is 101, blood  pressure 112/61 and pulse of 87.  GENERAL APPEARANCE:  In general she is a well-developed, well-nourished  white female who appears to be uncomfortable lying in bed.  SKIN:  The patient's skin is warm and dry with no jaundice.  HEENT:  Eyes; extraocular muscles are intact.  Pupils equal, round and react  to light.  Sclerae nonicteric.  LUNGS:  The lungs are clear bilaterally with no use of accessory respiratory  muscles.  HEART:  The  heart has a regular rate and rhythm with an impulse in the left  chest.  ABDOMEN:  The abdomen is diffusely tender with guarding and what appears to  be diffuse peritonitis, and some rigidity.  EXTREMITIES:  No cyanosis, clubbing or edema with good strength in the arms  and legs.  PSYCHOLOGIC:  The patient is alert and oriented times three with no evidence  of anxiety or depression.   LABORATORY DATA:  On review of her lab work her white count was 15.4 with  90% segs.  Her liver functions were normal.  CT scan was reviewed with the  radiologist and showed ascites, peritoneal inflammation and a possible small  area of free air anterior to the stomach.   ASSESSMENT AND PLAN:  This is a 45 year old white female with peritonitis.  Her findings and history are very worrisome for a perforated duodenal ulcer.  Given her peritonitis I think she needs to be explored tonight.   I have  explained to her in detail the risks and benefits of the operation to  explore the abdomen and repair what may be causing her pain; and, she  understands and wishes to proceed.  I have also talked with her about the  possibility of needing blood given her anemia and she is agreeable to this.  We will plan for this tonight in the operating room.      PST/MEDQ  D:  07/08/2004  T:  07/08/2004  Job:  [id number]

## 2010-11-12 NOTE — Discharge Summary (Signed)
[name], [name]               ACCOUNT NO.:  [id number]   MEDICAL RECORD NO.:  [id number]          PATIENT TYPE:  INP   LOCATION:  5738                         FACILITY:  [facility]   PHYSICIAN:  Vikki Ports, MDDATE OF BIRTH:  1965-09-20   DATE OF ADMISSION:  07/08/2004  DATE OF DISCHARGE:  07/14/2004                                 DISCHARGE SUMMARY   DISCHARGE DIAGNOSES:  1.  Perforated prepyloric ulcer, status post repair on July 08, 2004.  2.  Peritonitis, treated.  3.  History of anemia.  4.  Tobacco abuse.   HOSPITAL COURSE:  [name] is a 45 year old female, who presented with  abdominal pain that started on the morning of admission. These symptoms were  associated with nausea and vomiting. She has complained of upset stomach  for 2-3 weeks prior to admission and took some Alka-Seltzer, as well as  Goody powders. The CT scan showed ascites, peritoneal inflammation, and  possible free air.   The patient was taken to the operating room and found to have a perforated  prepyloric ulcer and underwent a Graham patch closure of this perforation.  The surgery was performed by Dr. Carolynne Edouard.   The patient was noted to be anemic after surgery and she was transfused with  two units of packed red blood cells. Otherwise, her diet was gradually  increased over the next several days and she seemed to tolerate this well.  By July 14, 2004, she was ready to go home and she was discharged to home  in stable and improved condition.   DISCHARGE MEDICATIONS:  1.  Protonix 40 mg daily.  2.  Vicodin 1-2 tablets every six hours as-needed for pain.   DISCHARGE SPECIAL INSTRUCTIONS:  1.  No lifting over 10 pounds, until released from the office.  2.  Diet is as tolerated.  3.  Clean surgical area gently, no scrubbing.  4.  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   DISCHARGE FOLLOW-UP:  She is to call [phone] for an appointment next week  to take out  the staples.      LB/MEDQ  D:  07/14/2004  T:  07/14/2004  Job:  [id number]   cc:   Ollen Gross. Vernell Morgans, M.D.  [address], [address]  [place]  [place] [address]

## 2011-03-17 LAB — COMPREHENSIVE METABOLIC PANEL
ALT: 19
BUN: 14
Calcium: 9.8
Creatinine, Ser: 0.59
Glucose, Bld: 132 — ABNORMAL HIGH
Sodium: 135
Total Protein: 8.1

## 2011-03-17 LAB — URINALYSIS, ROUTINE W REFLEX MICROSCOPIC
Glucose, UA: NEGATIVE
Leukocytes, UA: NEGATIVE
Nitrite: NEGATIVE
Specific Gravity, Urine: 1.034 — ABNORMAL HIGH
pH: 6.5

## 2011-03-17 LAB — DIFFERENTIAL
Lymphocytes Relative: 10 — ABNORMAL LOW
Lymphs Abs: 0.9
Monocytes Relative: 4
Neutro Abs: 7.9 — ABNORMAL HIGH
Neutrophils Relative %: 85 — ABNORMAL HIGH

## 2011-03-17 LAB — URINE MICROSCOPIC-ADD ON

## 2011-03-17 LAB — LIPASE, BLOOD: Lipase: 15

## 2011-03-17 LAB — CBC
Hemoglobin: 16.3 — ABNORMAL HIGH
MCHC: 33.8
MCV: 95.8
RDW: 13.4

## 2011-03-17 LAB — PREGNANCY, URINE: Preg Test, Ur: NEGATIVE

## 2012-02-08 ENCOUNTER — Encounter: Payer: Self-pay | Admitting: Internal Medicine

## 2012-02-14 ENCOUNTER — Emergency Department (HOSPITAL_COMMUNITY)
Admission: EM | Admit: 2012-02-14 | Discharge: 2012-02-14 | Disposition: A | Discharge status: Home / Self Care | Payer: Self-pay | Attending: Emergency Medicine | Admitting: Emergency Medicine

## 2012-02-14 ENCOUNTER — Encounter (HOSPITAL_COMMUNITY): Payer: Self-pay | Admitting: *Deleted

## 2012-02-14 DIAGNOSIS — R109 Unspecified abdominal pain: Secondary | ICD-10-CM | POA: Insufficient documentation

## 2012-02-14 DIAGNOSIS — R112 Nausea with vomiting, unspecified: Secondary | ICD-10-CM | POA: Insufficient documentation

## 2012-02-14 DIAGNOSIS — F17200 Nicotine dependence, unspecified, uncomplicated: Secondary | ICD-10-CM | POA: Insufficient documentation

## 2012-02-14 DIAGNOSIS — I10 Essential (primary) hypertension: Secondary | ICD-10-CM | POA: Insufficient documentation

## 2012-02-14 HISTORY — DX: Essential (primary) hypertension: I10

## 2012-02-14 HISTORY — DX: Encounter for other specified aftercare: Z51.89

## 2012-02-14 LAB — BASIC METABOLIC PANEL
Calcium: 10.1 mg/dL (ref 8.4–10.5)
Chloride: 95 mEq/L — ABNORMAL LOW (ref 96–112)
Creatinine, Ser: 0.54 mg/dL (ref 0.50–1.10)
GFR calc Af Amer: >90 mL/min (ref >90)

## 2012-02-14 LAB — URINALYSIS, ROUTINE W REFLEX MICROSCOPIC
Bilirubin Urine: NEGATIVE
Hgb urine dipstick: NEGATIVE
Ketones, ur: NEGATIVE mg/dL
Nitrite: NEGATIVE
Urobilinogen, UA: 1 mg/dL (ref 0.0–1.0)
pH: 8.5 — ABNORMAL HIGH (ref 5.0–8.0)

## 2012-02-14 LAB — POCT PREGNANCY, URINE: Preg Test, Ur: NEGATIVE

## 2012-02-14 LAB — CBC WITH DIFFERENTIAL/PLATELET
Basophils Absolute: 0 10*3/uL (ref 0.0–0.1)
Lymphocytes Relative: 19 % (ref 12–46)
Neutro Abs: 6.9 10*3/uL (ref 1.7–7.7)
Platelets: 403 10*3/uL — ABNORMAL HIGH (ref 150–400)
RDW: 13.7 % (ref 11.5–15.5)
WBC: 10.8 10*3/uL — ABNORMAL HIGH (ref 4.0–10.5)

## 2012-02-14 MED ORDER — SODIUM CHLORIDE 0.9 % IV BOLUS (SEPSIS)
1000.0000 mL | Freq: Once | INTRAVENOUS | Status: AC
  Administered 2012-02-14: 1000 mL via INTRAVENOUS

## 2012-02-14 MED ORDER — ONDANSETRON HCL 4 MG/2ML IJ SOLN
4.0000 mg | Freq: Once | INTRAMUSCULAR | Status: AC
  Administered 2012-02-14: 4 mg via INTRAVENOUS
  Filled 2012-02-14: qty 2

## 2012-02-14 MED ORDER — PROMETHAZINE HCL 25 MG PO TABS: 25.0000 mg | ORAL_TABLET | Freq: Four times a day (QID) | ORAL | Status: DC | PRN

## 2012-02-14 MED ORDER — MORPHINE SULFATE 4 MG/ML IJ SOLN
4.0000 mg | Freq: Once | INTRAMUSCULAR | Status: AC
  Administered 2012-02-14: 4 mg via INTRAVENOUS
  Filled 2012-02-14: qty 1

## 2012-02-14 NOTE — ED Provider Notes (Signed)
History     CSN: [id number]  Arrival date & time 02/14/12  1033   First MD Initiated Contact with Patient 02/14/12 1211      Chief Complaint  Patient presents with  . Abdominal Pain    (Consider location/radiation/quality/duration/timing/severity/associated sxs/prior treatment) HPI Comments: Patient reports a 1 week history of upper abdominal pain. For the past week she has constant sharp upper abdominal pain with associated nausea and vomiting. She reports having a history of gastric ulcers with perforation. She takes omeprazole regularly for acid reflux but it has not been helping lately. She reports decreased appetite and not being able to keep food and drink on her stomach. She has an appointment to address her ulcers in 2 days. She denies recent illness, fever, diarrhea, headache.   Patient is a 46 y.o. female presenting with abdominal pain.  Abdominal Pain The primary symptoms of the illness include abdominal pain, nausea and vomiting. The primary symptoms of the illness do not include fever, fatigue, shortness of breath, diarrhea or dysuria.  Symptoms associated with the illness do not include chills, diaphoresis, constipation or back pain.    Past Medical History  Diagnosis Date  . Ulcer   . Blood transfusion   . Hypertension     Past Surgical History  Procedure Date  . Abdominal surgery     No family history on file.  History  Substance Use Topics  . Smoking status: Current Everyday Smoker  . Smokeless tobacco: Not on file  . Alcohol Use: Yes     occ    OB History    Grav Para Term Preterm Abortions TAB SAB Ect Mult Living                  Review of Systems  Constitutional: Positive for appetite change. Negative for fever, chills, diaphoresis and fatigue.  Eyes: Negative for visual disturbance.  Respiratory: Negative for cough and shortness of breath.   Gastrointestinal: Positive for nausea, vomiting and abdominal pain. Negative for diarrhea and  constipation.  Genitourinary: Negative for dysuria and flank pain.  Musculoskeletal: Negative for back pain.  Skin: Negative for rash and wound.  Neurological: Negative for dizziness, light-headedness and headaches.    Allergies  Review of patient's allergies indicates no known allergies.  Home Medications   Current Outpatient Rx  Name Route Sig Dispense Refill  . OMEPRAZOLE 20 MG PO CPDR Oral Take 20 mg by mouth daily.      BP 161/97  Pulse 104  Temp 98.4 F (36.9 C) (Oral)  Resp 16  SpO2 95%  LMP 01/26/2012  Physical Exam  Nursing note and vitals reviewed. Constitutional: She is oriented to person, place, and time. She appears well-developed and well-nourished. No distress.  HENT:  Head: Normocephalic and atraumatic.  Mouth/Throat: Oropharynx is clear and moist. No oropharyngeal exudate.  Eyes: Conjunctivae are normal. Pupils are equal, round, and reactive to light. No scleral icterus.  Neck: Normal range of motion.  Cardiovascular: Normal rate and regular rhythm.  Exam reveals no gallop and no friction rub.   No murmur heard. Pulmonary/Chest: Effort normal. No respiratory distress. She has no wheezes. She has no rales. She exhibits no tenderness.  Abdominal: Soft. There is tenderness. There is no rebound and no guarding.       Generalized upper abdominal tenderness to palpation.   Musculoskeletal: Normal range of motion.  Neurological: She is alert and oriented to person, place, and time.  Skin: Skin is warm and dry. She is  not diaphoretic.  Psychiatric: She has a normal mood and affect. Her behavior is normal.    ED Course  Procedures (including critical care time)  Labs Reviewed  URINALYSIS, ROUTINE W REFLEX MICROSCOPIC - Abnormal; Notable for the following:    APPearance HAZY (*)     pH 8.5 (*)     All other components within normal limits  BASIC METABOLIC PANEL - Abnormal; Notable for the following:    Potassium 3.3 (*)     Chloride 95 (*)     Glucose,  Bld 126 (*)     All other components within normal limits  CBC WITH DIFFERENTIAL - Abnormal; Notable for the following:    WBC 10.8 (*)     Platelets 403 (*)     Monocytes Relative 13 (*)     Monocytes Absolute 1.4 (*)     All other components within normal limits  POCT PREGNANCY, URINE  LIPASE, BLOOD   No results found.   No diagnosis found.    MDM  1:11 PM Patient has a follow up appointment in 2 days for acid reflux/gastric ulcer. Her lab results are normal so far. If her lipase returns normal, she can be discharged with symptomatic treatment of her nausea and vomiting. I will bolus her with 1 liter of fluid due to her history of vomiting and being unable to keep food and drink down.   1:41 PM Patient's lipase is not elevated. She can be discharged and will follow up with her doctor in 2 days. I will send her home with phenergan for nausea. No further evaluation needed at this time.       Emilia Beck, PA-C 02/14/12 1342

## 2012-02-14 NOTE — ED Notes (Signed)
Tresa Endo, EMT undressed pt, placed in a gown

## 2012-02-14 NOTE — ED Notes (Signed)
Pt is here with abdominal pain to mid upper quad and reports vomiting and vomiting some blood.  Small amount of blood.  Hx of ulcers with operation

## 2012-02-15 NOTE — ED Provider Notes (Signed)
Medical screening examination/treatment/procedure(s) were performed by non-physician practitioner and as supervising physician I was immediately available for consultation/collaboration.  Shelda Jakes, MD 02/15/12 2002

## 2012-02-16 ENCOUNTER — Encounter: Payer: Self-pay | Admitting: Internal Medicine

## 2012-02-16 ENCOUNTER — Ambulatory Visit (INDEPENDENT_AMBULATORY_CARE_PROVIDER_SITE_OTHER): Payer: Self-pay | Admitting: Internal Medicine

## 2012-02-16 VITALS — BP 154/89 | HR 93 | Temp 98.1°F | Ht 63.0 in | Wt 153.6 lb

## 2012-02-16 DIAGNOSIS — Z23 Encounter for immunization: Secondary | ICD-10-CM

## 2012-02-16 DIAGNOSIS — N63 Unspecified lump in unspecified breast: Secondary | ICD-10-CM

## 2012-02-16 DIAGNOSIS — Z8639 Personal history of other endocrine, nutritional and metabolic disease: Secondary | ICD-10-CM

## 2012-02-16 DIAGNOSIS — Z1239 Encounter for other screening for malignant neoplasm of breast: Secondary | ICD-10-CM

## 2012-02-16 DIAGNOSIS — R1013 Epigastric pain: Secondary | ICD-10-CM

## 2012-02-16 DIAGNOSIS — I10 Essential (primary) hypertension: Secondary | ICD-10-CM

## 2012-02-16 DIAGNOSIS — K279 Peptic ulcer, site unspecified, unspecified as acute or chronic, without hemorrhage or perforation: Secondary | ICD-10-CM

## 2012-02-16 DIAGNOSIS — D509 Iron deficiency anemia, unspecified: Secondary | ICD-10-CM

## 2012-02-16 DIAGNOSIS — E876 Hypokalemia: Secondary | ICD-10-CM

## 2012-02-16 MED ORDER — OMEPRAZOLE 40 MG PO CPDR: 40.0000 mg | DELAYED_RELEASE_CAPSULE | Freq: Two times a day (BID) | ORAL | Status: DC

## 2012-02-16 NOTE — Assessment & Plan Note (Signed)
Referred for b/l diagnostic mammo today

## 2012-02-16 NOTE — Patient Instructions (Signed)
-  Please increase Prilosec to 40mg  twice daily.  -Please return stool sample as we discussed.  -Please discontinue use of ALL NSAIDs (including ibuprofen, alieve, motrin, advil, alkaseltzer, etc) - this is EXTREMELY important  -If you start to feel dizzy or light-headed, experience low blood pressure, or similar to when your blood levels dropped to 2.6, please call the clinic as soon as possible, or be evaluated in the emergency.  Please be sure to bring all of your medications with you to every visit.  Should you have any new or worsening symptoms, please be sure to call the clinic at [phone].

## 2012-02-16 NOTE — Assessment & Plan Note (Signed)
Blood pressure is 154/89 today. Pressure is elevated. She has been on antihypertensives in the past, but she cannot recall which one at this time.  -Postpone management of hypertension until epigastric pain has resolved -her pain is likely a exacerbating her blood pressure; also, if she is having a slow GI bleed, I do not want to lower her blood pressure

## 2012-02-16 NOTE — Assessment & Plan Note (Signed)
Likely due to self-induced vomiting. Please see plan for her epigastric pain.  -Repeat bmet in 2 weeks

## 2012-02-16 NOTE — Assessment & Plan Note (Signed)
Most likely due to history of peptic ulcer disease. FOBT was positive, but patient does have history of hemorrhoids. Hemoglobin on 02/14/2012 was 14.8.  -Discontinue all NSAIDs -H. pylori stool antigen test (patient to return stool within the next few days, she was instructed that stool must be returned within 2 hours of bowel movement)  -It stool antigen if positive, will proceed with metronidazole, omeprazole and clarithromycin treatment -Escalate PPI therapy to Prilosec 40 mg twice daily -Return in 2 weeks for followup, repeat CBC at that time  -If hemoglobin drops is noted at that time, refer to GI (patient was seen by Dr. Bosie Clos in the hospital in the past)

## 2012-02-16 NOTE — Progress Notes (Addendum)
Subjective:   Patient ID: [name] female   DOB: 1966/04/01 46 y.o.   MRN: [id number]  HPI: [name] [name] is a 68 y.o. woman with history of peptic ulcer disease and hypertension who presents today to establish herself in the clinic.  Epigastric pain: She has long standing history of ulcer disease and in 2006 she was hospitalized with GI bleed due to her peptic ulcer.  Peptic ulcer at that time was thought to be due to NSAIDs. She underwent exploratory laparotomy that revealed prepyloric gastric perforation and underwent Graham patch repair of gastric perforation. For the last week, she complains of burning sharp pains in her epigastric region where her ulcer disease and bothered her in the past. Pain is not as bad as it was back then. She was seen in the emergency room on 02/14/2012 for this problem. At that time labs were stable. She was hemodynamically stable, similar to today. She notes dark stools, but has been taking Pepto-Bismol lately. She is ultimately taken Alka-Seltzer, ibuprofen, Aleve, and Tylenol. She denies feeling nauseated, but reports self-induced vomiting in order to release pressure.  Past Medical History  Diagnosis Date  . Ulcer   . Blood transfusion   . Hypertension   . ANEMIA-IRON DEFICIENCY 11/22/2006    Annotation: lowest Hb = 2.6 (!) while in the hospital 10/2006 - likely GI bleed related; resolved    Current Outpatient Prescriptions  Medication Sig Dispense Refill  . omeprazole (PRILOSEC) 40 MG capsule Take 1 capsule (40 mg total) by mouth 2 (two) times daily.  60 capsule  3  . DISCONTD: omeprazole (PRILOSEC) 20 MG capsule Take 20 mg by mouth daily.       Family History  Problem Relation Age of Onset  . Ulcers Father   . GER disease Father    History   Social History  . Marital Status: Married    Spouse Name: Nelva Hauk    Number of Children: 0  . Years of Education: 12th   Occupational History  . Caregiver    Social History Main Topics  .  Smoking status: Current Everyday Smoker  . Smokeless tobacco: None  . Alcohol Use: Yes     occ  . Drug Use: No  . Sexually Active:    Other Topics Concern  . None   Social History Narrative  . None   Review of Systems: Constitutional: Denies fever, chills, diaphoresis, appetite change and fatigue.  HEENT: Denies photophobia, eye pain, redness, hearing loss, ear pain, congestion, sore throat, rhinorrhea, sneezing, mouth sores, trouble swallowing, neck pain, neck stiffness and tinnitus.   Respiratory: Denies SOB, DOE, cough, chest tightness,  and wheezing.   Cardiovascular: Denies chest pain, palpitations and leg swelling.  Gastrointestinal: Denies nausea, diarrhea, constipation, blood in stool and abdominal distention.  Genitourinary: Denies dysuria, urgency, frequency, hematuria, flank pain and difficulty urinating.  Musculoskeletal: Denies myalgias, back pain, joint swelling, arthralgias and gait problem.  Skin: Denies pallor, rash and wound.  Neurological: Denies dizziness, seizures, syncope, weakness, numbness and headaches.   Objective:  Physical Exam: Filed Vitals:   02/16/12 1504  BP: 154/89  Pulse: 93  Temp: 98.1 F (36.7 C)  TempSrc: Oral  Height: 5\' 3"  (1.6 m)  Weight: 153 lb 9.6 oz (69.673 kg)  SpO2: 99%   Constitutional: Vital signs reviewed.  Patient is a well-developed and well-nourished woman in no acute distress and cooperative with exam. Mouth: no erythema or exudates, MMM, poor dentition Eyes: PERRL, EOMI, conjunctivae normal,  No scleral icterus.  Cardiovascular: RRR, S1 normal, S2 normal, no MRG, pulses symmetric and intact bilaterally Pulmonary/Chest: CTAB, no wheezes, rales, or rhonchi Abdominal: Soft. Tender to epigastric region, non-distended, bowel sounds are normal, no masses, organomegaly, or guarding present. FOBT positive GU: no CVA tenderness Musculoskeletal: No joint deformities, erythema, or stiffness, ROM full and no nontender Neurological:  A&O x3, Strength is normal and symmetric bilaterally, cranial nerve II-XII are grossly intact, no focal motor deficit, sensory intact to light touch bilaterally.  Skin: Warm, dry and intact. No rash, cyanosis, or clubbing.  Psychiatric: Normal mood and affect. speech and behavior is normal. Judgment and thought content normal. Cognition and memory are normal.   Assessment & Plan:   Case and care discussed with Dr. Eben Burow. Patient to return in 2 weeks for followup of abdominal pain and to repeat CBC and bmet. Please see problem-oriented charting for further details.

## 2012-02-29 ENCOUNTER — Encounter: Payer: Self-pay | Admitting: Internal Medicine

## 2012-02-29 ENCOUNTER — Ambulatory Visit (INDEPENDENT_AMBULATORY_CARE_PROVIDER_SITE_OTHER): Payer: Self-pay | Admitting: Internal Medicine

## 2012-02-29 VITALS — BP 158/85 | HR 80 | Temp 98.3°F | Ht 63.0 in | Wt 156.3 lb

## 2012-02-29 DIAGNOSIS — I10 Essential (primary) hypertension: Secondary | ICD-10-CM

## 2012-02-29 DIAGNOSIS — Z862 Personal history of diseases of the blood and blood-forming organs and certain disorders involving the immune mechanism: Secondary | ICD-10-CM

## 2012-02-29 DIAGNOSIS — K279 Peptic ulcer, site unspecified, unspecified as acute or chronic, without hemorrhage or perforation: Secondary | ICD-10-CM

## 2012-02-29 DIAGNOSIS — R1013 Epigastric pain: Secondary | ICD-10-CM

## 2012-02-29 LAB — CBC
Platelets: 454 10*3/uL — ABNORMAL HIGH (ref 150–400)
RBC: 4.29 MIL/uL (ref 3.87–5.11)
WBC: 11.5 10*3/uL — ABNORMAL HIGH (ref 4.0–10.5)

## 2012-02-29 MED ORDER — OMEPRAZOLE 40 MG PO CPDR: 40.0000 mg | DELAYED_RELEASE_CAPSULE | Freq: Two times a day (BID) | ORAL | Status: DC

## 2012-02-29 MED ORDER — HYDROCHLOROTHIAZIDE 25 MG PO TABS: 12.5000 mg | ORAL_TABLET | Freq: Every day | ORAL | Status: DC

## 2012-02-29 NOTE — Patient Instructions (Signed)
-  Start hydrochlorothiazide 12.5mg  daily for blood pressure management.  -Continue Prilosec 40mg  twice daily for your stomach, in about a month, we may try to bring you down to once daily  -Also, in 1 month, we will try to do your pap smear.  Please be sure to bring all of your medications with you to every visit.  Should you have any new or worsening symptoms, please be sure to call the clinic at [phone].

## 2012-02-29 NOTE — Progress Notes (Signed)
Subjective:   Patient ID: DEMETRIUS [name] female   DOB: 1966/03/13 46 y.o.   MRN: [id number]  HPI: [name] is a 46 y.o. woman with h/o PUD who returns today for 2 week follow up.  Since starting Prilosec 40 mg twice daily, she notes no more nausea or vomiting. She has discontinued all NSAIDs. No more abdominal pain.   Past Medical History  Diagnosis Date  . Ulcer   . Blood transfusion   . Hypertension   . ANEMIA-IRON DEFICIENCY 11/22/2006    Annotation: lowest Hb = 2.6 (!) while in the hospital 10/2006 - likely GI bleed related; resolved    Current Outpatient Prescriptions  Medication Sig Dispense Refill  . omeprazole (PRILOSEC) 40 MG capsule Take 1 capsule (40 mg total) by mouth 2 (two) times daily.  60 capsule  3  . DISCONTD: omeprazole (PRILOSEC) 40 MG capsule Take 1 capsule (40 mg total) by mouth 2 (two) times daily.  60 capsule  3  . hydrochlorothiazide (HYDRODIURIL) 25 MG tablet Take 0.5 tablets (12.5 mg total) by mouth daily.  30 tablet  1   Family History  Problem Relation Age of Onset  . Ulcers Father   . GER disease Father    History   Social History  . Marital Status: Married    Spouse Name: Kelbie Moro    Number of Children: 0  . Years of Education: 12th   Occupational History  . Caregiver    Social History Main Topics  . Smoking status: Current Everyday Smoker -- 0.5 packs/day    Types: Cigarettes  . Smokeless tobacco: None  . Alcohol Use: Yes     occ  . Drug Use: No  . Sexually Active: None   Other Topics Concern  . None   Social History Narrative  . None   Review of Systems: Constitutional: Denies fever, chills, diaphoresis, appetite change and fatigue.  HEENT: Denies photophobia, eye pain, redness, hearing loss, ear pain, congestion, sore throat, rhinorrhea, sneezing, mouth sores, trouble swallowing, neck pain, neck stiffness and tinnitus.   Respiratory: Denies SOB, DOE, cough, chest tightness,  and wheezing.   Cardiovascular: Denies  chest pain, palpitations and leg swelling.  Gastrointestinal: Denies nausea, vomiting, abdominal pain, diarrhea, constipation, blood in stool and abdominal distention.  Genitourinary: Denies dysuria, urgency, frequency, hematuria, flank pain and difficulty urinating.  Musculoskeletal: Denies myalgias, back pain, joint swelling, arthralgias and gait problem.  Skin: Denies pallor, rash and wound.  Neurological: Denies dizziness, seizures, syncope, weakness, light-headedness, numbness and headaches.   Objective:  Physical Exam: Filed Vitals:   02/29/12 1617  BP: 158/85  Pulse: 80  Temp: 98.3 F (36.8 C)  TempSrc: Oral  Height: 5\' 3"  (1.6 m)  Weight: 156 lb 4.8 oz (70.897 kg)  SpO2: 99%   Constitutional: Vital signs reviewed.  Patient is a well-developed and well-nourished woman in no acute distress and cooperative with exam. Mouth: no erythema or exudates, MMM, poor dentition Eyes: PERRL, EOMI, conjunctivae normal, No scleral icterus.  Cardiovascular: RRR, S1 normal, S2 normal, no MRG, pulses symmetric and intact bilaterally Pulmonary/Chest: CTAB, no wheezes, rales, or rhonchi Abdominal: Soft. Non-tender, non-distended, bowel sounds are normal, no masses, organomegaly, or guarding present.  Neurological: A&O x3 Skin: Warm, dry and intact. No rash, cyanosis, or clubbing.  Psychiatric: Normal mood and affect. speech and behavior is normal. Judgment and thought content normal. Cognition and memory are normal.   Assessment & Plan:   Case and care discussed with Dr. Criselda Peaches.  Patient to return in one month for blood pressure check and bmet.  She was on her cycle today, so we will also postpone Pap smear to one month visit.

## 2012-02-29 NOTE — Assessment & Plan Note (Signed)
Resolved with Prilosec 40 mg twice daily. Patient brought stool sample today for H. pylori testing.  -Continue Prilosec 40 mg twice daily, at least for one more month, then we may consider decreasing to once daily. -if stool antigen test is positive, we will proceed with metronidazole, omeprazole, and clarithromycin treatment -Repeat CBC today to monitor for hemoglobin drop in setting of positive fobt at last visit.

## 2012-02-29 NOTE — Assessment & Plan Note (Signed)
bmet today (was likely due to self-induced vomiting)

## 2012-02-29 NOTE — Assessment & Plan Note (Signed)
-  bmet today in setting of recent hypokalemia -Start HCTZ 12.5 daily -Return in one month for followup

## 2012-03-01 LAB — BASIC METABOLIC PANEL
CO2: 23 mEq/L (ref 19–32)
Calcium: 8.9 mg/dL (ref 8.4–10.5)
Chloride: 109 mEq/L (ref 96–112)
Sodium: 140 mEq/L (ref 135–145)

## 2012-03-01 LAB — HELICOBACTER PYLORI  SPECIAL ANTIGEN

## 2012-03-13 ENCOUNTER — Ambulatory Visit (HOSPITAL_COMMUNITY)
Admission: RE | Admit: 2012-03-13 | Discharge: 2012-03-13 | Disposition: A | Discharge status: Home / Self Care | Payer: Self-pay | Source: Ambulatory Visit | Attending: Obstetrics and Gynecology | Admitting: Obstetrics and Gynecology

## 2012-03-13 ENCOUNTER — Other Ambulatory Visit (HOSPITAL_COMMUNITY)
Admission: RE | Admit: 2012-03-13 | Discharge: 2012-03-13 | Disposition: A | Discharge status: Home / Self Care | Payer: Self-pay | Source: Ambulatory Visit | Attending: Obstetrics and Gynecology | Admitting: Obstetrics and Gynecology

## 2012-03-13 ENCOUNTER — Other Ambulatory Visit: Payer: Self-pay | Admitting: Obstetrics and Gynecology

## 2012-03-13 ENCOUNTER — Encounter (HOSPITAL_COMMUNITY): Payer: Self-pay

## 2012-03-13 DIAGNOSIS — N6323 Unspecified lump in the left breast, lower outer quadrant: Secondary | ICD-10-CM

## 2012-03-13 DIAGNOSIS — Z01419 Encounter for gynecological examination (general) (routine) without abnormal findings: Secondary | ICD-10-CM

## 2012-03-16 ENCOUNTER — Ambulatory Visit
Admission: RE | Admit: 2012-03-16 | Discharge: 2012-03-16 | Disposition: A | Discharge status: Home / Self Care | Payer: No Typology Code available for payment source | Source: Ambulatory Visit | Attending: Obstetrics and Gynecology | Admitting: Obstetrics and Gynecology

## 2012-03-16 DIAGNOSIS — N6323 Unspecified lump in the left breast, lower outer quadrant: Secondary | ICD-10-CM

## 2012-03-30 ENCOUNTER — Encounter: Payer: Self-pay | Admitting: Internal Medicine

## 2012-03-30 ENCOUNTER — Ambulatory Visit (INDEPENDENT_AMBULATORY_CARE_PROVIDER_SITE_OTHER): Payer: Self-pay | Admitting: Internal Medicine

## 2012-03-30 VITALS — BP 148/96 | HR 82 | Temp 98.1°F | Ht 63.0 in | Wt 154.1 lb

## 2012-03-30 DIAGNOSIS — N63 Unspecified lump in unspecified breast: Secondary | ICD-10-CM

## 2012-03-30 DIAGNOSIS — R1013 Epigastric pain: Secondary | ICD-10-CM

## 2012-03-30 DIAGNOSIS — Z23 Encounter for immunization: Secondary | ICD-10-CM

## 2012-03-30 DIAGNOSIS — I10 Essential (primary) hypertension: Secondary | ICD-10-CM

## 2012-03-30 MED ORDER — HYDROCHLOROTHIAZIDE 25 MG PO TABS: 25.0000 mg | ORAL_TABLET | Freq: Every day | ORAL | Status: DC

## 2012-03-30 NOTE — Progress Notes (Signed)
  Subjective:   Patient ID: [name] female   DOB: 1965/08/29 46 y.o.   MRN: [id number]  HPI: [name] is a 46 y.o. woman with history of HTN & PUD who presents today for HTN follow up.  One month prior she was started on HCTZ 12.5, which she has been taking daily.  No CP/SOB/palpitations/HA/vision changes.  She notes 3 episodes of pain in stomach (from PUD, see prior from 02/16/12)  She continues to abstain from NSAIDs.    Past Medical History  Diagnosis Date  . Ulcer   . Blood transfusion   . Hypertension   . ANEMIA-IRON DEFICIENCY 11/22/2006    Annotation: lowest Hb = 2.6 (!) while in the hospital 10/2006 - likely GI bleed related; resolved    Current Outpatient Prescriptions  Medication Sig Dispense Refill  . hydrochlorothiazide (HYDRODIURIL) 25 MG tablet Take 0.5 tablets (12.5 mg total) by mouth daily.  30 tablet  1  . omeprazole (PRILOSEC) 40 MG capsule Take 1 capsule (40 mg total) by mouth 2 (two) times daily.  60 capsule  3   Family History  Problem Relation Age of Onset  . Ulcers Father   . GER disease Father    History   Social History  . Marital Status: Married    Spouse Name: Ankita Fabiano    Number of Children: 0  . Years of Education: 12th   Occupational History  . Caregiver    Social History Main Topics  . Smoking status: Current Every Day Smoker -- 0.5 packs/day    Types: Cigarettes  . Smokeless tobacco: None   Comment: "I need to quit"  . Alcohol Use: Yes     sometimes  . Drug Use: No  . Sexually Active: None   Other Topics Concern  . None   Social History Narrative  . None   Review of Systems: General: no fevers, chills, changes in weight, changes in appetite Skin: no rash HEENT: no blurry vision, hearing changes, sore throat Pulm: no dyspnea, coughing, wheezing CV: no chest pain, palpitations, shortness of breath Abd: no nausea/vomiting, diarrhea/constipation GU: no dysuria, hematuria, polyuria Ext: no arthralgias,  myalgias Neuro: no weakness, numbness, or tingling   Objective:  Physical Exam: Filed Vitals:   03/30/12 1530  BP: 148/96  Pulse: 82  Temp: 98.1 F (36.7 C)  TempSrc: Oral  Height: 5\' 3"  (1.6 m)  Weight: 154 lb 1.6 oz (69.899 kg)  SpO2: 100%   Constitutional: Vital signs reviewed. Patient is a well-developed and well-nourished woman in no acute distress and cooperative with exam.  Mouth: no erythema or exudates, MMM, poor dentition  Eyes: PERRL, EOMI, conjunctivae normal, No scleral icterus.  Cardiovascular: RRR, S1 normal, S2 normal, no MRG, pulses symmetric and intact bilaterally  Pulmonary/Chest: CTAB, no wheezes, rales, or rhonchi  Abdominal: Soft. Non-tender, non-distended, bowel sounds are normal, no masses, organomegaly, or guarding present.  Neurological: A&O x3  Skin: Warm, dry and intact. No rash, cyanosis, or clubbing.  Psychiatric: Normal mood and affect. speech and behavior is normal. Judgment and thought content normal. Cognition and memory are normal.    Assessment & Plan:   Case and care discussed with Dr. Kem Kays. Patient to return in 1 month for blood pressure follow up and BMET.  Also, assess need for GI referral.

## 2012-03-30 NOTE — Assessment & Plan Note (Signed)
Diagnostic mammo done 02/2012.  Negative. Repeat mammo in 1 year.

## 2012-03-30 NOTE — Patient Instructions (Addendum)
Increase Hydrochlorothiazide to 25mg  daily  Continue prilosec twice daily  Please be sure to bring all of your medications with you to every visit.  Should you have any new or worsening symptoms, please be sure to call the clinic at [phone].

## 2012-03-30 NOTE — Assessment & Plan Note (Signed)
Lab Results  Component Value Date   NA 140 02/29/2012   K 4.6 02/29/2012   CL 109 02/29/2012   CO2 23 02/29/2012   BUN 17 02/29/2012   CREATININE 0.56 02/29/2012   CREATININE 0.54 02/14/2012    BP Readings from Last 3 Encounters:  03/30/12 148/96  02/29/12 158/85  02/16/12 154/89    Assessment: Hypertension control:  mildly elevated  Progress toward goals:  improved Barriers to meeting goals:  no barriers identified  Plan: Hypertension treatment:  Increase to HCTZ 25 and return in one month for recheck and BMET

## 2012-03-30 NOTE — Assessment & Plan Note (Addendum)
Continues prilosec 40 BID.  Stool sample negative for h. Pylori but could be false negative in setting of PPI.  Hb was stable in 02/2012.  Patient to return to clinic in one month, if symptoms persist, then refer to GI (scoped by Dr. Bosie Clos in the past)

## 2012-04-16 NOTE — Progress Notes (Signed)
See detailed notes scanned under media. 

## 2012-04-16 NOTE — Patient Instructions (Signed)
See detailed notes scanned under media. 

## 2012-05-02 ENCOUNTER — Telehealth (HOSPITAL_COMMUNITY): Payer: Self-pay | Admitting: *Deleted

## 2012-05-02 NOTE — Telephone Encounter (Signed)
Telephoned patient's home # and left message with female at residence to return call to [facility]

## 2012-05-02 NOTE — Telephone Encounter (Signed)
Patient returned call. Advised of normal pap smear and negative diagnostic mammogram. Patient voiced understanding.

## 2012-05-16 ENCOUNTER — Ambulatory Visit (INDEPENDENT_AMBULATORY_CARE_PROVIDER_SITE_OTHER): Payer: Self-pay | Admitting: Internal Medicine

## 2012-05-16 ENCOUNTER — Encounter: Payer: Self-pay | Admitting: Internal Medicine

## 2012-05-16 VITALS — BP 127/83 | HR 83 | Temp 97.9°F | Wt 161.2 lb

## 2012-05-16 DIAGNOSIS — N63 Unspecified lump in unspecified breast: Secondary | ICD-10-CM

## 2012-05-16 DIAGNOSIS — I10 Essential (primary) hypertension: Secondary | ICD-10-CM

## 2012-05-16 DIAGNOSIS — R1013 Epigastric pain: Secondary | ICD-10-CM

## 2012-05-16 LAB — BASIC METABOLIC PANEL
BUN: 21 mg/dL (ref 6–23)
CO2: 26 mEq/L (ref 19–32)
Glucose, Bld: 87 mg/dL (ref 70–99)
Potassium: 3.3 mEq/L — ABNORMAL LOW (ref 3.5–5.3)

## 2012-05-16 NOTE — Assessment & Plan Note (Signed)
Negative diagnostic mammo and [place] in 02/2012.  Patient to have annual screening mammo.

## 2012-05-16 NOTE — Assessment & Plan Note (Signed)
Continues to have epigastric burning pain, with h/o PUD with perforation in the past.  Stool sample was negative for h. Pylori, but may be false as she is on PPI.  At this point, will refer to GI for EGD for biopsy.

## 2012-05-16 NOTE — Patient Instructions (Signed)
-  Continue hydrochlorothiazide 25mg  daily  -Continue omeprazole 40mg  daily, but be sure to follow up with gastroenterology.  Please be sure to bring all of your medications with you to every visit.  Should you have any new or worsening symptoms, please be sure to call the clinic at [phone].

## 2012-05-16 NOTE — Progress Notes (Signed)
Subjective:   Patient ID: [name] female   DOB: 1965-08-15 46 y.o.   MRN: [id number]  HPI: [name] is a 10 y.o. woman with history of HTN and PUD who presents today for routine follow up of BP.  She has no complaints regarding blood pressure since starting HCTZ.  Denies CP/SOB/dizziness.   Regarding PUD/GERD, she is compliant with omeprazole 40mg  daily, and continues to have intermittent epigastric burning.  Vomited once in the last month.    Past Medical History  Diagnosis Date  . Ulcer   . Blood transfusion   . Hypertension   . ANEMIA-IRON DEFICIENCY 11/22/2006    Annotation: lowest Hb = 2.6 (!) while in the hospital 10/2006 - likely GI bleed related; resolved   . PUD, HX OF 11/22/2006    Annotation: w/ perforation s/p repair 06/2004 Qualifier: Diagnosis of  By: Elvera Lennox MD, Silvestre Mesi     Current Outpatient Prescriptions  Medication Sig Dispense Refill  . hydrochlorothiazide (HYDRODIURIL) 25 MG tablet Take 1 tablet (25 mg total) by mouth daily.  30 tablet  1  . omeprazole (PRILOSEC) 40 MG capsule Take 1 capsule (40 mg total) by mouth 2 (two) times daily.  60 capsule  3   Family History  Problem Relation Age of Onset  . Ulcers Father   . GER disease Father    History   Social History  . Marital Status: Married    Spouse Name: Aviance Cooperwood    Number of Children: 0  . Years of Education: 12th   Occupational History  . Caregiver    Social History Main Topics  . Smoking status: Current Every Day Smoker -- 0.5 packs/day    Types: Cigarettes  . Smokeless tobacco: None     Comment: "I need to quit"  . Alcohol Use: Yes     Comment: sometimes  . Drug Use: No  . Sexually Active: None   Other Topics Concern  . None   Social History Narrative  . None   Review of Systems: Constitutional: Denies fever, chills, diaphoresis, appetite change and fatigue.  HEENT: Denies photophobia, eye pain, redness, hearing loss, ear pain, congestion, sore throat, rhinorrhea,  sneezing, mouth sores, trouble swallowing, neck pain, neck stiffness and tinnitus.   Respiratory: Denies SOB, DOE, cough, chest tightness,  and wheezing.   Cardiovascular: Denies chest pain, palpitations and leg swelling.  Gastrointestinal: Denies nausea, diarrhea, constipation, blood in stool and abdominal distention.  Genitourinary: Denies dysuria, urgency, frequency, hematuria, flank pain and difficulty urinating.  Musculoskeletal: Denies myalgias, back pain, joint swelling, arthralgias and gait problem.  Skin: Denies pallor, rash and wound.  Neurological: Denies dizziness, seizures, syncope, weakness, light-headedness, numbness and headaches.  Psychiatric/Behavioral: Denies suicidal ideation, mood changes, confusion, nervousness, sleep disturbance and agitation  Objective:  Physical Exam: Filed Vitals:   05/16/12 1511  BP: 127/83  Pulse: 83  Temp: 97.9 F (36.6 C)  TempSrc: Oral  Weight: 161 lb 3.2 oz (73.12 kg)  SpO2: 100%   Constitutional: Vital signs reviewed.  Patient is a well-developed and well-nourished woman in no acute distress and cooperative with exam. Head: Normocephalic and atraumatic Mouth: no erythema or exudates, MMM, poor dentition Eyes: PERRL, EOMI, conjunctivae normal, No scleral icterus.  Cardiovascular: RRR, S1 normal, S2 normal, no MRG, pulses symmetric and intact bilaterally Pulmonary/Chest: CTAB, no wheezes, rales, or rhonchi Abdominal: Soft. Non-tender, non-distended, bowel sounds are normal, no masses, organomegaly, or guarding present.  Neurological: A&O x3, Strength is normal and symmetric bilaterally, cranial  nerve II-XII are grossly intact, no focal motor deficit, sensory intact to light touch bilaterally.  Skin: Warm, dry and intact. No rash, cyanosis, or clubbing.  Psychiatric: Normal mood and affect. speech and behavior is normal. Judgment and thought content normal. Cognition and memory are normal.   Assessment & Plan:   Case and care discussed  with Dr. Lonzo Cloud. Please see problem oriented charting for further details. Patient to return in 6 months for BP follow up.

## 2012-05-17 ENCOUNTER — Encounter: Payer: Self-pay | Admitting: Gastroenterology

## 2012-05-21 ENCOUNTER — Telehealth: Payer: Self-pay | Admitting: Internal Medicine

## 2012-05-21 DIAGNOSIS — E876 Hypokalemia: Secondary | ICD-10-CM

## 2012-05-21 MED ORDER — POTASSIUM CHLORIDE CRYS ER 20 MEQ PO TBCR: 20.0000 meq | EXTENDED_RELEASE_TABLET | Freq: Two times a day (BID) | ORAL | Status: DC

## 2012-05-21 NOTE — Telephone Encounter (Signed)
i called the K+ into [facility], tried to call pt got vmail, left message, checked ph# w/ HD they have same one, will try tomorrow

## 2012-05-21 NOTE — Telephone Encounter (Signed)
I called in the AM and got female that stated pt was at work. Called back at 4:40 and got answering machine. K is low, has been in past, on HCTZ, no ACEI that might increase K if I supplement. I wanted to let pt know that K low, see if she has ever been on KCl before (didn;t see in EPIC that she had) and develop a plan. Since I can't talk to her, 1. Send in KCL 20 meq BID for 3 day 2. Forward to Dr Everardo Beals to see if worthwhile for pt to come in week of Dec 2nd for BMP and MAg. 3. Dr Everardo Beals can consider adding triamterene to HCTZ if needed.

## 2012-05-22 NOTE — Addendum Note (Signed)
Addended by: Blanch Media A on: 05/22/2012 09:27 AM   Modules accepted: Orders

## 2012-05-22 NOTE — Telephone Encounter (Signed)
Talked with pt and she thinks she may have taken K once about 3 - 4 years ago but she is not sure. She will go to [facility] and get KCL 20 meq as ordered. She will come in next week on 12/4 for repeat lab work. Can you please put in future lab request?

## 2012-05-22 NOTE — Telephone Encounter (Signed)
Order is in. Thank you.

## 2012-05-30 ENCOUNTER — Other Ambulatory Visit (INDEPENDENT_AMBULATORY_CARE_PROVIDER_SITE_OTHER): Payer: No Typology Code available for payment source

## 2012-05-30 DIAGNOSIS — I10 Essential (primary) hypertension: Secondary | ICD-10-CM

## 2012-05-30 LAB — BASIC METABOLIC PANEL
BUN: 17 mg/dL (ref 6–23)
CO2: 26 mEq/L (ref 19–32)
Calcium: 9.1 mg/dL (ref 8.4–10.5)
Creat: 0.66 mg/dL (ref 0.50–1.10)
Glucose, Bld: 99 mg/dL (ref 70–99)

## 2012-05-30 LAB — MAGNESIUM: Magnesium: 1.7 mg/dL (ref 1.5–2.5)

## 2012-06-04 ENCOUNTER — Other Ambulatory Visit: Payer: Self-pay | Admitting: Internal Medicine

## 2012-06-04 DIAGNOSIS — E876 Hypokalemia: Secondary | ICD-10-CM

## 2012-06-04 MED ORDER — POTASSIUM CHLORIDE CRYS ER 20 MEQ PO TBCR: 40.0000 meq | EXTENDED_RELEASE_TABLET | Freq: Every day | ORAL | Status: DC

## 2012-06-04 NOTE — Progress Notes (Signed)
Labs from 05/30/12 suggests hypokalemia, likely related to HCTZ.  Called patient to ask her to continue potassium daily and that she should return in 1 month for blood work.  She was not home, so I asked her husband to have her call clinic.  Orders placed. Will flag Lynn Ito as well.

## 2012-06-05 ENCOUNTER — Other Ambulatory Visit: Payer: Self-pay | Admitting: *Deleted

## 2012-06-05 DIAGNOSIS — I10 Essential (primary) hypertension: Secondary | ICD-10-CM

## 2012-06-06 NOTE — Addendum Note (Signed)
Encounter addended by: Zayd Bonet Poll Keimya Briddell, RN on: 06/06/2012  2:34 PM<BR>     Documentation filed: Charges VN

## 2012-06-07 MED ORDER — HYDROCHLOROTHIAZIDE 25 MG PO TABS: 25.0000 mg | ORAL_TABLET | Freq: Every day | ORAL | Status: DC

## 2012-06-08 ENCOUNTER — Ambulatory Visit: Payer: Self-pay | Admitting: Gastroenterology

## 2012-06-08 NOTE — Telephone Encounter (Signed)
Rx called in to pharmacy. 

## 2012-07-10 ENCOUNTER — Encounter: Payer: Self-pay | Admitting: *Deleted

## 2012-07-23 NOTE — Addendum Note (Signed)
Addended by: Neomia Dear on: 07/23/2012 06:55 PM   Modules accepted: Orders

## 2012-08-02 ENCOUNTER — Telehealth: Payer: Self-pay | Admitting: *Deleted

## 2012-08-02 ENCOUNTER — Other Ambulatory Visit (INDEPENDENT_AMBULATORY_CARE_PROVIDER_SITE_OTHER): Payer: No Typology Code available for payment source

## 2012-08-02 DIAGNOSIS — E876 Hypokalemia: Secondary | ICD-10-CM

## 2012-08-02 LAB — BASIC METABOLIC PANEL
Calcium: 9 mg/dL (ref 8.4–10.5)
Chloride: 107 mEq/L (ref 96–112)
Creat: 0.58 mg/dL (ref 0.50–1.10)
Glucose, Bld: 95 mg/dL (ref 70–99)

## 2012-08-02 NOTE — Telephone Encounter (Signed)
Pt informed

## 2012-08-02 NOTE — Telephone Encounter (Signed)
Pt called to state she will have labs drawn today. She thought you were going to take her off HCTZ and start another med.   At this time she is still on hctz. She wanted you to know the she cancelled her GI referral appointment and she is feeling much better.  She will not go at this time.

## 2012-08-02 NOTE — Telephone Encounter (Signed)
Regarding HCTZ, will make decision after today's labs are resulted, so please ask her not to refill medications until she hears from me.  Thank you for letting me know about her cancelled GI appt - if symptoms recur, she will need to be evaluated by GI.

## 2012-09-05 ENCOUNTER — Other Ambulatory Visit: Payer: Self-pay | Admitting: *Deleted

## 2012-09-05 DIAGNOSIS — I10 Essential (primary) hypertension: Secondary | ICD-10-CM

## 2012-09-05 MED ORDER — HYDROCHLOROTHIAZIDE 25 MG PO TABS: 25.0000 mg | ORAL_TABLET | Freq: Every day | ORAL | Status: DC

## 2012-09-05 NOTE — Telephone Encounter (Signed)
Rx called in 

## 2012-09-05 NOTE — Telephone Encounter (Signed)
Pt is out of meds and needs to get today.

## 2012-12-18 ENCOUNTER — Encounter: Payer: Self-pay | Admitting: Internal Medicine

## 2013-01-24 ENCOUNTER — Ambulatory Visit: Payer: Self-pay | Admitting: Internal Medicine

## 2013-01-24 ENCOUNTER — Encounter (HOSPITAL_COMMUNITY): Payer: Self-pay | Admitting: General Practice

## 2013-01-24 ENCOUNTER — Encounter: Payer: Self-pay | Admitting: Internal Medicine

## 2013-01-24 ENCOUNTER — Inpatient Hospital Stay (HOSPITAL_COMMUNITY)
Admission: AD | Admit: 2013-01-24 | Discharge: 2013-01-28 | DRG: 379 | Disposition: A | Discharge status: Home / Self Care | Payer: MEDICAID | Source: Ambulatory Visit | Attending: Internal Medicine | Admitting: Internal Medicine

## 2013-01-24 VITALS — BP 145/106 | HR 108 | Temp 97.1°F | Wt 156.9 lb

## 2013-01-24 DIAGNOSIS — Z8711 Personal history of peptic ulcer disease: Secondary | ICD-10-CM

## 2013-01-24 DIAGNOSIS — K922 Gastrointestinal hemorrhage, unspecified: Secondary | ICD-10-CM

## 2013-01-24 DIAGNOSIS — R1013 Epigastric pain: Secondary | ICD-10-CM

## 2013-01-24 DIAGNOSIS — R109 Unspecified abdominal pain: Secondary | ICD-10-CM

## 2013-01-24 DIAGNOSIS — K279 Peptic ulcer, site unspecified, unspecified as acute or chronic, without hemorrhage or perforation: Secondary | ICD-10-CM

## 2013-01-24 DIAGNOSIS — I10 Essential (primary) hypertension: Secondary | ICD-10-CM | POA: Diagnosis present

## 2013-01-24 DIAGNOSIS — E43 Unspecified severe protein-calorie malnutrition: Secondary | ICD-10-CM

## 2013-01-24 DIAGNOSIS — F17200 Nicotine dependence, unspecified, uncomplicated: Secondary | ICD-10-CM | POA: Diagnosis present

## 2013-01-24 DIAGNOSIS — D509 Iron deficiency anemia, unspecified: Secondary | ICD-10-CM

## 2013-01-24 DIAGNOSIS — K920 Hematemesis: Secondary | ICD-10-CM

## 2013-01-24 DIAGNOSIS — K254 Chronic or unspecified gastric ulcer with hemorrhage: Principal | ICD-10-CM | POA: Diagnosis present

## 2013-01-24 DIAGNOSIS — E876 Hypokalemia: Secondary | ICD-10-CM

## 2013-01-24 HISTORY — DX: Gastro-esophageal reflux disease without esophagitis: K21.9

## 2013-01-24 LAB — COMPREHENSIVE METABOLIC PANEL
ALT: 11 U/L (ref 0–35)
AST: 13 U/L (ref 0–37)
Albumin: 4.5 g/dL (ref 3.5–5.2)
Alkaline Phosphatase: 91 U/L (ref 39–117)
CO2: 28 mEq/L (ref 19–32)
Chloride: 86 mEq/L — ABNORMAL LOW (ref 96–112)
Glucose, Bld: 110 mg/dL — ABNORMAL HIGH (ref 70–99)
Potassium: 2.6 mEq/L — CL (ref 3.5–5.3)
Sodium: 136 mEq/L (ref 135–145)
Total Protein: 8.7 g/dL — ABNORMAL HIGH (ref 6.0–8.3)

## 2013-01-24 LAB — APTT: aPTT: 28 seconds (ref 24–37)

## 2013-01-24 LAB — PROTIME-INR
INR: 0.94 (ref <1.50)
Prothrombin Time: 12.4 seconds (ref 11.6–15.2)

## 2013-01-24 LAB — LIPASE, BLOOD: Lipase: 13 U/L (ref 11–59)

## 2013-01-24 LAB — CBC
Hemoglobin: 14.9 g/dL (ref 12.0–15.0)
MCV: 89.9 fL (ref 78.0–100.0)
Platelets: 439 10*3/uL — ABNORMAL HIGH (ref 150–400)
RBC: 4.76 MIL/uL (ref 3.87–5.11)
RDW: 14.2 % (ref 11.5–15.5)
WBC: 12.5 10*3/uL — ABNORMAL HIGH (ref 4.0–10.5)

## 2013-01-24 MED ORDER — POTASSIUM CHLORIDE 10 MEQ/100ML IV SOLN
10.0000 meq | INTRAVENOUS | Status: AC
  Administered 2013-01-24 – 2013-01-25 (×6): 10 meq via INTRAVENOUS
  Filled 2013-01-24: qty 300
  Filled 2013-01-24: qty 100
  Filled 2013-01-24: qty 200

## 2013-01-24 MED ORDER — SODIUM CHLORIDE 0.9 % IV SOLN
INTRAVENOUS | Status: AC
  Administered 2013-01-24 – 2013-01-25 (×2): via INTRAVENOUS

## 2013-01-24 MED ORDER — ONDANSETRON HCL 4 MG PO TABS: 4.0000 mg | ORAL_TABLET | Freq: Four times a day (QID) | ORAL | Status: DC | PRN

## 2013-01-24 MED ORDER — PANTOPRAZOLE SODIUM 40 MG IV SOLR: 40.0000 mg | Freq: Two times a day (BID) | INTRAVENOUS | Status: DC

## 2013-01-24 MED ORDER — PANTOPRAZOLE SODIUM 40 MG IV SOLR
40.0000 mg | Freq: Two times a day (BID) | INTRAVENOUS | Status: DC
  Administered 2013-01-24 – 2013-01-28 (×8): 40 mg via INTRAVENOUS
  Filled 2013-01-24 (×8): qty 40

## 2013-01-24 MED ORDER — PNEUMOCOCCAL VAC POLYVALENT 25 MCG/0.5ML IJ INJ
0.5000 mL | INJECTION | INTRAMUSCULAR | Status: AC
  Administered 2013-01-25: 0.5 mL via INTRAMUSCULAR
  Filled 2013-01-24: qty 0.5

## 2013-01-24 MED ORDER — POTASSIUM CHLORIDE 10 MEQ/100ML IV SOLN: 10.0000 meq | INTRAVENOUS | Status: DC

## 2013-01-24 MED ORDER — MORPHINE SULFATE 2 MG/ML IJ SOLN: 2.0000 mg | INTRAMUSCULAR | Status: DC | PRN

## 2013-01-24 MED ORDER — SODIUM CHLORIDE 0.9 % IJ SOLN
3.0000 mL | Freq: Two times a day (BID) | INTRAMUSCULAR | Status: DC
  Administered 2013-01-26 – 2013-01-28 (×5): 3 mL via INTRAVENOUS

## 2013-01-24 MED ORDER — ONDANSETRON HCL 4 MG/2ML IJ SOLN
4.0000 mg | Freq: Four times a day (QID) | INTRAMUSCULAR | Status: DC | PRN
  Administered 2013-01-24: 4 mg via INTRAVENOUS
  Filled 2013-01-24: qty 2

## 2013-01-24 NOTE — Assessment & Plan Note (Signed)
4 days of worsening abdominal pain and vomiting.  Hx of complicated PUD with GIB and perforation.  Did not follow up with GI in December.  Orange card needs to be renewed.  Has not been able to keep down food or fluids for the past 4 days.  Reports passing gas once a day lately but no BM x3 days.     ?recurrent GIB in setting of PUD, perforation? Bowel obstruction?  Will admit to IMTS today for further evaluation.

## 2013-01-24 NOTE — Progress Notes (Signed)
Subjective:   Patient ID: [name] female   DOB: 09-07-1965 47 y.o.   MRN: [id number]  HPI: [name] is a 87 y.o. white female with complicated history of PUD (perforation in the past along with severe GIB) presenting to [facility] today for acute visit with complaints of severe periumbilical abdominal pain associated with vomiting worsening over the past 4 days.  She normally has mild abdominal pain at baseline, but lately it has gotten worse despite taking her omeprazole twice a day.  She has not eaten anything for the past 4 days and has also barely drank any water.  She denies nausea but has had almost daily vomiting with "coffee ground" color but today it was clear and right after she tried to drink some soup.  She denies fever or chills, chest pain, or shortness of breath or any urinary complaints but does have intermittent headaches.  Her BP is elevated today up to 148/101 with HR of 106.  She was supposed to see GI as referred by PCP in December, however, she canceled her appointment at that time since she was having dental surgery and her abdominal pain was stable.  Unfortunately, since then, her orange card needs to get renewed and now she is having these symptoms.    She continues to smoke cigarettes approximately 1/2ppd and occasionally drinks alcohol and coffee.  She has used a lot of goody powders in the past and has recently used [employer] which helped a little but then she ended up vomiting.  She does admit to passing gas at least once a day and had not had a BM in the past 3 days and minimal urination since she has not been eating or drinking anything.    Per d/c summary from 5/20078 when she was admitted for acute on chronic blood loss anemia 2/2 GIB:   EGD during that admission by Dr. Bosie Clos showed:  large, shallow clean-based ulcer at distal stomach. No active bleeding upon insertion, no source of anemia found and small hiatal hernia.   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 Capsule endoscopy on May 27 of 2008--showed multiple distal small bowel nonbleeding angioectasias, gastric ulcer, and scattered erosions.     During that admission, she was apparently transfused 4 units and also received inFED infusions.    She will be admitted to IMTS today for further evaluation.    Past Medical History  Diagnosis Date  . Blood transfusion   . Hypertension   . 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 Peptic ulcer disease 11/22/2006    EGD (Dr. Bosie Clos) large, shallow clean-based ulcer at distal stomach. No active bleeding and small hiatal hernia. 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  . Perforated ulcer 07/08/2004    thought to be secondary to NSAID use; s/p Cheree Ditto patch repair (Dr. Carolynne Edouard)   Current Outpatient Prescriptions  Medication Sig Dispense Refill  . hydrochlorothiazide (HYDRODIURIL) 25 MG tablet Take 1 tablet (25 mg total) by  mouth daily.  30 tablet  2  . omeprazole (PRILOSEC) 40 MG capsule Take 1 capsule (40 mg total) by mouth 2 (two) times daily.  60 capsule  3   No current facility-administered medications for this visit.   Family History  Problem Relation Age of Onset  . Ulcers Father   . GER disease Father    History   Social History  . Marital Status: Married    Spouse Name: [name]    Number of Children: 0  . Years of Education: 12th    Occupational History  . Caregiver    Social History Main Topics  . Smoking status: Current Every Day Smoker -- 0.50 packs/day    Types: Cigarettes  . Smokeless tobacco: None     Comment: "I need to quit"  . Alcohol Use: Yes     Comment: wine occasionally.  . Drug Use: No  . Sexually Active: None   Other Topics Concern  . None   Social History Narrative  . None   Review of Systems:  Constitutional:  Decrease appetite and fatigue.  Denies fever, chills, diaphoresis.  HEENT:  Denies congestion, sore throat  Respiratory:  Denies SOB, DOE, cough, and wheezing.   Cardiovascular:  Denies chest pain, palpitations, and leg swelling.   Gastrointestinal:  Vomiting and abdominal pain.     Genitourinary:  Denies dysuria, urgency, frequency, hematuria, flank pain and difficulty urinating.   Musculoskeletal:  Denies myalgias, back pain, joint swelling, arthralgias and gait problem.   Skin:  Denies pallor, rash and wound.   Neurological:  Headaches.  Denies dizziness, syncope, weakness, light-headedness, numbness.   Objective:  Physical Exam: Filed Vitals:   01/24/13 1525 01/24/13 1554  BP: 148/101 145/106  Pulse: 106 108  Temp: 97.1 F (36.2 C)   TempSrc: Oral   Weight: 156 lb 14.4 oz (71.169 kg)   SpO2: 98%    Vitals reviewed. General: sitting in chair, acute distress due to abdominal pain HEENT: PERRL, EOMI, no scleral icterus Cardiac: tachycardia Pulm: clear to auscultation bilaterally, no wheezes, rales, or rhonchi Abd: soft, tenderness to even light palpation mainly in epigastric region and right side of abdomen, voluntary guarding, +midline abdominal well healed surgical scar, BS present Ext: warm and well perfused, no pedal edema, +2DP B/L Neuro: alert and oriented X3, cranial nerves II-XII grossly intact, strength and sensation to light touch equal in bilateral upper and lower extremities  Assessment & Plan:  Discussed with Dr. Aundria Rud and Dr. Dierdre Searles will admit to IMTS  service  Will admit to IMTS for GIB and likely dehydration setting of decreased po intake with abdominal pain and vomiting in setting of hx of complicated PUD.

## 2013-01-24 NOTE — Assessment & Plan Note (Addendum)
BP Readings from Last 3 Encounters:  01/24/13 145/106  05/16/12 127/83  03/30/12 148/96    Lab Results  Component Value Date   NA 140 08/02/2012   K 4.1 08/02/2012   CREATININE 0.58 08/02/2012    Assessment: Blood pressure control: severely elevated Progress toward BP goal:  deteriorated Comments: BP likely high in setting of dehydration and abdominal pain.  Did take BP medication but vomiting so likely has not been able to keep it down.  Does report headache, but denies syncope, vision changes, diaphoresis, chest pain, or shortness of breath  Plan: Medications:  will be admitted to IMTS for further evaluation today, needs pain control and IVF as well

## 2013-01-24 NOTE — H&P (Signed)
Date: 01/24/2013               Patient Name:  [name] MRN: [id number]  DOB: 12/03/1965 Age / Sex: 47 y.o., female   PCP: Belia Heman, MD         Medical Service: Internal Medicine Teaching Service         Attending Physician: Dr. Jonah Blue, DO    First Contact: Dr. Dierdre Searles Pager: [phone]  Second Contact: Dr. Johna Roles Pager: [phone]       After Hours (After 5p/  First Contact Pager: [phone]  weekends / holidays): Second Contact Pager: [phone]   Chief Complaint: Abdominal pain  History of Present Illness:  Patient  Presented with a 4 day hx of abd pain, in the epigastric region,intermittent, non radiating, though she said she has some back pain, directly posterior to the site of the abd pain, rates the pain as a 7/10, aggrav by food intake, relieved by alka saltser and by vomiting. Vomitus was desrcibed as mostly coffee grounds- dark,last coffee grounds- yesterday,but has been clear all day today, 3-4 episodes a day since Sunday. She has not had a bowel movement for the past two days, though she reports hardly any food intake since Sunday, no episodes of diarrhoea, and stools have been normal colour. She also reports having some dizziness when she stands up initially and weakness with activity.   She reports she has lost 10 pounds in the past week, due to poor PO intake. She has a hx of PUD, had her first episode- 2004, with perforation, for which she had an abdominal surg. She was taking motrin, approximately 1-2 tablets twice a day, about 3-4 times a week for about 2-3 weeks prior to abd pain. Other medications are omeprazole and HCT. She smokes-1/2 a pack a day for the past 30years, takes alcohol occasionally. Her last EGD was in 2008- by Dr. Bosie Clos- large, shallow clean-based ulcer at distal stomach. No active bleeding upon insertion, no source of anemia found and small hiatal hernia , then she was transfused with 4 units of blood for acute on chronic blood  Loss.    Meds: Current  Facility-Administered Medications  Medication Dose Route Frequency Provider Last Rate Last Dose  . 0.9 %  sodium chloride infusion   Intravenous Continuous Na Li, MD 75 mL/hr at 01/24/13 1805    . morphine 2 MG/ML injection 2 mg  2 mg Intravenous Q4H PRN Na Li, MD      . ondansetron (ZOFRAN) tablet 4 mg  4 mg Oral Q6H PRN Na Li, MD       Or  . ondansetron (ZOFRAN) injection 4 mg  4 mg Intravenous Q6H PRN Na Li, MD   4 mg at 01/24/13 1904  . pantoprazole (PROTONIX) injection 40 mg  40 mg Intravenous Q12H Na Li, MD   40 mg at 01/24/13 1905  . potassium chloride 10 mEq in 100 mL IVPB  10 mEq Intravenous Q1 Hr x 6 Christen Bame, MD      . sodium chloride 0.9 % injection 3 mL  3 mL Intravenous Q12H Dede Query, MD        Allergies: Allergies as of 01/24/2013  . (No Known Allergies)   Past Medical History  Diagnosis Date  . Blood transfusion   . Hypertension   . 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 Peptic  ulcer disease 11/22/2006    EGD (Dr. Bosie Clos) large, shallow clean-based ulcer at distal stomach. No active bleeding and small hiatal hernia. 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  . Perforated ulcer 07/08/2004    thought to be secondary to NSAID use; s/p Cheree Ditto patch repair (Dr. Carolynne Edouard)   Past Surgical History  Procedure Laterality Date  . Abdominal surgery      for ulcer   Family History  Problem Relation Age of Onset  . Ulcers Father   . GER disease Father    History   Social History  . Marital Status: Married    Spouse Name: Zariah Cavendish    Number of Children: 0  . Years of Education: 12th   Occupational History  . Caregiver    Social History Main Topics  . Smoking  status: Current Every Day Smoker -- 0.50 packs/day    Types: Cigarettes  . Smokeless tobacco: Not on file     Comment: "I need to quit"  . Alcohol Use: Yes     Comment: wine occasionally.  . Drug Use: No  . Sexually Active: Not on file   Other Topics Concern  . Not on file   Social History Narrative  . No narrative on file    Review of Systems: Eyes: No blurred vision or changes in vision. Ears, nose, mouth, throat, and face: no neck pain, mouth ulcers. Respiratory: no cough, no ficulty breathing. Cardiovascular: no chest pain, no leg swelling. Genitourinary: no dysuria or frequency Musculoskeletal: No leg weakness or abnormal sensation Neurological: No seizures, mild headaches occasionally.  Physical Exam: Blood pressure 142/87, pulse 90, temperature 99 F (37.2 C), temperature source Oral, resp. rate 16, height 5\' 3"  (1.6 m), weight 156 lb (70.761 kg), SpO2 97.00%. General appearance: alert, cooperative, appears stated age and no distress Head: Normocephalic, without obvious abnormality, atraumatic Eyes: conjunctivae/corneas clear. PERRL, EOM's intact. Fundi benign. Lungs: clear to auscultation bilaterally Heart: regular rate and rhythm, S1, S2 normal, no murmur, click, rub or gallop Abdomen: soft, mid line abdominal scar, tenderness in the epigastric region, no rebound or guarding, bowel sounds normal; no masses,  no organomegaly Extremities: extremities normal, atraumatic, no cyanosis or edema Pulses: 2+, symm  Lab results: Basic Metabolic Panel:  Recent Labs  78/29/56 1638  NA 136  K 2.6*  CL 86*  CO2 28  GLUCOSE 110*  BUN 20  CREATININE 0.68  CALCIUM 10.1   Liver Function Tests:  Recent Labs  01/24/13 1638  AST 13  ALT 11  ALKPHOS 91  BILITOT 0.2*  PROT 8.7*  ALBUMIN 4.5   CBC:  Recent Labs  01/24/13 1638  WBC 12.5*  HGB 14.9  HCT 42.8  MCV 89.9  PLT 439*   Coagulation:  Recent Labs  01/24/13 1638  LABPROT 12.4  INR 0.94     Assessment & Plan by Problem: Active Problems:   HYPERTENSION   Hematemesis   Abdominal  pain, other specified site  # Abdominal pain and hematemesis- Possible aetiologies- Gastritis- considering px recent hx of NSAID intake, epigastric pain and vomiting is typical, only an endoscopy can definitely diagnose this. PUD- considering px hx, which could have been provoked by recent NSAID use, will monitor for recurrence of bleeding, and rapid drop in HCT. Acute Pancreatitis is a possibiity- considering  epigastric region, back pain also present, and vomiting, a serum lipase will help determine, but no hx of signif alcohol use or hx of gallstones. Intestinal obstruction is a possibility, as px has a hx of previous abd surg, but absence of hyper-active bowel sounds, abd distension, or generalized abd pain makes this less likley. Incisional hernia is also a possibilty considering hx of surgery in the past, but no signs of obstruction present- normal bowel movement and  passing gas.  - NPO - CBC Q6H. - Lipase -CMP- showed a K- 2.6, low Cl- 86. Due to GI loss- Vomiting. Repeat BMP after correction.  - Give potassium chloride 10 mEq  IV, Q1 Hr x 6,  -Preg test-Neg. -Coag profile- PT, INR, APTT. - Serum Lactic acid. - Mg. - IV morphine for pain. -IV pantoprazole 40mg  Q12H. -Ondansetron for vomiting, PRN. # Hypertension- patient is on HCT , which will be continued on admission.    Dispo: Disposition is deferred at this time, awaiting improvement of current medical problems. Anticipated discharge in approximately 1-2 day(s).   The patient does not have a current PCP (Neema Davina Poke, MD) and does need an [facility] hospital follow-up appointment after discharge.  The patient does not know have transportation limitations that hinder transportation to clinic appointments.  Signed: Kennis Carina, MD 01/24/2013, 8:41 PM

## 2013-01-24 NOTE — Progress Notes (Signed)
Case discussed with Dr. Qureshi at the time of the visit.  We reviewed the resident's history and exam and pertinent patient test results.  I agree with the assessment, diagnosis, and plan of care documented in the resident's note. 

## 2013-01-25 DIAGNOSIS — E43 Unspecified severe protein-calorie malnutrition: Secondary | ICD-10-CM | POA: Insufficient documentation

## 2013-01-25 DIAGNOSIS — R1013 Epigastric pain: Secondary | ICD-10-CM

## 2013-01-25 LAB — CBC
HCT: 36.5 % (ref 36.0–46.0)
HCT: 35.6 % — ABNORMAL LOW (ref 36.0–46.0)
HCT: 31.7 % — ABNORMAL LOW (ref 36.0–46.0)
MCH: 30.6 pg (ref 26.0–34.0)
MCH: 30.9 pg (ref 26.0–34.0)
MCH: 31.1 pg (ref 26.0–34.0)
MCH: 31.1 pg (ref 26.0–34.0)
MCHC: 33.9 g/dL (ref 30.0–36.0)
MCHC: 34.3 g/dL (ref 30.0–36.0)
MCHC: 34.5 g/dL (ref 30.0–36.0)
MCHC: 34.7 g/dL (ref 30.0–36.0)
MCV: 90.3 fL (ref 78.0–100.0)
Platelets: 368 10*3/uL (ref 150–400)
Platelets: 393 10*3/uL (ref 150–400)
Platelets: 400 10*3/uL (ref 150–400)
Platelets: 405 10*3/uL — ABNORMAL HIGH (ref 150–400)
RBC: 4.04 MIL/uL (ref 3.87–5.11)
RBC: 3.79 MIL/uL — ABNORMAL LOW (ref 3.87–5.11)
RDW: 14.1 % (ref 11.5–15.5)
RDW: 14 % (ref 11.5–15.5)
RDW: 13.8 % (ref 11.5–15.5)
WBC: 8 10*3/uL (ref 4.0–10.5)

## 2013-01-25 LAB — BASIC METABOLIC PANEL
Calcium: 9.3 mg/dL (ref 8.4–10.5)
Creatinine, Ser: 0.56 mg/dL (ref 0.50–1.10)
GFR calc Af Amer: >90 mL/min (ref >90)

## 2013-01-25 MED ORDER — BOOST / RESOURCE BREEZE PO LIQD
1.0000 | Freq: Every day | ORAL | Status: DC
  Administered 2013-01-25 – 2013-01-27 (×3): 1 via ORAL

## 2013-01-25 NOTE — Progress Notes (Signed)
Subjective: Patient feels better. Tolerate CL well this am. No c/o N/V since last night. Last vomiting was brown color stomach content.  Reports less epigastric pain.  No acute event overnight.   Objective: Vital signs in last 24 hours: Filed Vitals:   01/24/13 1758 01/25/13 0558 01/25/13 0700 01/25/13 1430  BP: 142/87 133/81  122/74  Pulse: 90 90  81  Temp: 99 F (37.2 C) 98 F (36.7 C)  97.9 F (36.6 C)  TempSrc: Oral Oral  Oral  Resp: 16 18  18   Height:      Weight:   157 lb 3.2 oz (71.305 kg)   SpO2: 97% 93%  98%   Weight change:   Intake/Output Summary (Last 24 hours) at 01/25/13 2014 Last data filed at 01/25/13 1300  Gross per 24 hour  Intake   1140 ml  Output    640 ml  Net    500 ml   General: NAD Lungs: normal respiratory effort, no accessory muscle use, normal breath sounds, no crackles, and no wheezes. Heart: normal rate, regular rhythm, no murmur, no gallop, and no rub.  Abdomen: soft, mid line abdominal scar, tenderness in the epigastric region, no rebound or guarding, bowel sounds normal; no masses, no organomegaly Extremities: No cyanosis, clubbing, edema Neurologic: alert & oriented X3, cranial nerves II-XII intact, strength normal in all extremities, sensation intact to light touch, and gait normal.  Skin: turgor normal and no rashes.    Lab Results: Basic Metabolic Panel:  Recent Labs Lab 01/24/13 1638 01/24/13 1954 01/25/13 0510  [name] 136  --  135  K 2.6*  --  3.6  CL 86*  --  95*  CO2 28  --  25  GLUCOSE 110*  --  102*  BUN 20  --  22  CREATININE 0.68  --  0.56  CALCIUM 10.1  --  9.3  MG  --  2.1  --    Liver Function Tests:  Recent Labs Lab 01/24/13 1638  AST 13  ALT 11  ALKPHOS 91  BILITOT 0.2*  PROT 8.7*  ALBUMIN 4.5    Recent Labs Lab 01/24/13 1954  LIPASE 13   CBC:  Recent Labs Lab 01/25/13 1105 01/25/13 1700  WBC 9.3 8.1  HGB 12.2 11.6*  HCT 35.6* 34.2*  MCV 90.8 90.2  PLT 392 393    Coagulation:  Recent Labs Lab 01/24/13 1638  LABPROT 12.4  INR 0.94   Medications: I have reviewed the patient's current medications. Scheduled Meds: . feeding supplement  1 Container Oral Q1500  . pantoprazole (PROTONIX) IV  40 mg Intravenous Q12H  . sodium chloride  3 mL Intravenous Q12H   Continuous Infusions:  PRN Meds:.morphine injection, ondansetron (ZOFRAN) IV, ondansetron Assessment/Plan:  Patient is a 47 year old woman with PMH of PUD and perforation requiring surgery in 2004, who presents to the hospital with acute onset of epigastric pain and coffee ground emesis in a setting of recent heavy use of NSAIDs.  #Hematemesis    Patient no longer has hematemesis since admission.  She feels better and able to tolerate glucose today.  HH slightly trending down from ~14-->~12  - Appreciate GI consult - Continue CL - Continue IV fluids - Protonix IV twice a day - Morphine and Zofran when necessary - Plan for EGD a.m. per GI.  #Hypertension  - HCTZ on hold  # VTE: SCD  Dispo: Disposition is deferred at this time, awaiting improvement of current medical problems.  Anticipated discharge  in approximately 1-2 day(s).   The patient does have a current PCP (Tara Davina Poke, MD) and does need an [facility] hospital follow-up appointment after discharge.  The patient does not have transportation limitations that hinder transportation to clinic appointments.  .Services Needed at time of discharge: Y = Yes, Blank = No PT:   OT:   RN:   Equipment:   Other:     LOS: 1 day   Dede Query, MD 01/25/2013, 8:14 PM

## 2013-01-25 NOTE — Consult Note (Signed)
[facility] Reason for consult: hematemesis Referring Physician: medical teaching service  [name] is an 47 y.o. female.  HPI: patient has history of surgery many years ago for perforation peptic ulcer. She came into the clinic yesterday after several days of vague abdominal pain nausea with vomiting of coffee ground material she reports that she has been on omeprazole chronically since her ulcer surgery. She used that he doubters in the past but is not had any recently. Her only NSAIDs use was ibuprofen following oral surgery several weeks ago. She reports that since coming in the hospital she has done better. She had colonoscopy 5 or 6 years ago with diverticulosis, AVM hyperplastic polyp. Her hemoglobin has been over 12 since her admission and she reports that her abdominal pain is better.  Past Medical History  Diagnosis Date  . Blood transfusion 2008    "related to bleeding ulcer?" (01/24/2013)  . Hypertension   . 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 Peptic ulcer disease 11/22/2006    EGD (Dr. Schooler) large, shallow clean-based ulcer at distal stomach. No active bleeding and small hiatal hernia. 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  . Perforated ulcer 07/08/2004    thought to be secondary to NSAID use; s/p Graham patch repair (Dr. Toth)  . GERD (gastroesophageal reflux disease)     Past Surgical History  Procedure Laterality Date  . Repair of perforated ulcer  06/2004    Family History  Problem Relation Age of Onset  . Ulcers Father   . GER disease Father     Social History:  reports that she has been smoking Cigarettes.  She has a 15  pack-year smoking history. She has never used smokeless tobacco. She reports that she drinks about 1.2 ounces of alcohol per week. She reports that she does not use illicit drugs.  Allergies: No Known Allergies  Medications; . pantoprazole (PROTONIX) IV  40 mg Intravenous Q12H  . sodium chloride  3 mL Intravenous Q12H   PRN Meds morphine injection, ondansetron (ZOFRAN) IV, ondansetron Results for orders placed during the hospital encounter of 01/24/13 (from the past 48 hour(s))  PREGNANCY, URINE     Status: None   Collection Time    01/24/13  6:10 PM      Result Value Range   Preg Test, Ur NEGATIVE  NEGATIVE   Comment:            THE SENSITIVITY OF THIS     METHODOLOGY IS >20 mIU/mL.  LACTIC ACID, PLASMA     Status: None   Collection Time    01/24/13  7:52 PM      Result Value Range   Lactic Acid, Venous 0.9  0.5 - 2.2 mmol/L  MAGNESIUM     Status: None   Collection Time    01/24/13  7:54 PM      Result Value Range   Magnesium 2.1  1.5 - 2.5 mg/dL  APTT     Status: None   Collection Time    01/24/13  7:54 PM      Result Value Range   aPTT 28  24 - 37 seconds  LIPASE, BLOOD     Status:   None   Collection Time    01/24/13  7:54 PM      Result Value Range   Lipase 13  11 - 59 U/L  CBC     Status: Abnormal   Collection Time    01/25/13 12:01 AM      Result Value Range   WBC 13.7 (*) 4.0 - 10.5 K/uL   RBC 4.15  3.87 - 5.11 MIL/uL   Hemoglobin 12.9  12.0 - 15.0 g/dL   HCT 37.4  36.0 - 46.0 %   MCV 90.1  78.0 - 100.0 fL   MCH 31.1  26.0 - 34.0 pg   MCHC 34.5  30.0 - 36.0 g/dL   RDW 14.2  11.5 - 15.5 %   Platelets 405 (*) 150 - 400 K/uL  BASIC METABOLIC PANEL     Status: Abnormal   Collection Time    01/25/13  5:10 AM      Result Value Range   Sodium 135  135 - 145 mEq/L   Potassium 3.6  3.5 - 5.1 mEq/L   Chloride 95 (*) 96 - 112 mEq/L   CO2 25  19 - 32 mEq/L   Glucose, Bld 102 (*) 70 - 99 mg/dL   BUN 22  6 - 23 mg/dL   Creatinine, Ser 0.56  0.50 - 1.10 mg/dL    Calcium 9.3  8.4 - 10.5 mg/dL   GFR calc non Af Amer >90  >90 mL/min   GFR calc Af Amer >90  >90 mL/min   Comment:            The eGFR has been calculated     using the CKD EPI equation.     This calculation has not been     validated in all clinical     situations.     eGFR's persistently     <90 mL/min signify     possible Chronic Kidney Disease.  CBC     Status: Abnormal   Collection Time    01/25/13  5:10 AM      Result Value Range   WBC 11.5 (*) 4.0 - 10.5 K/uL   RBC 4.04  3.87 - 5.11 MIL/uL   Hemoglobin 12.5  12.0 - 15.0 g/dL   HCT 36.5  36.0 - 46.0 %   MCV 90.3  78.0 - 100.0 fL   MCH 30.9  26.0 - 34.0 pg   MCHC 34.2  30.0 - 36.0 g/dL   RDW 14.1  11.5 - 15.5 %   Platelets 400  150 - 400 K/uL  CBC     Status: Abnormal   Collection Time    01/25/13 11:05 AM      Result Value Range   WBC 9.3  4.0 - 10.5 K/uL   RBC 3.92  3.87 - 5.11 MIL/uL   Hemoglobin 12.2  12.0 - 15.0 g/dL   HCT 35.6 (*) 36.0 - 46.0 %   MCV 90.8  78.0 - 100.0 fL   MCH 31.1  26.0 - 34.0 pg   MCHC 34.3  30.0 - 36.0 g/dL   RDW 14.1  11.5 - 15.5 %   Platelets 392  150 - 400 K/uL    No results found.             Blood pressure 122/74, pulse 81, temperature 97.9 F (36.6 C), temperature source Oral, resp. rate 18, height 5' 3" (1.6 m), weight 71.305 kg (157 lb 3.2 oz), SpO2 98.00%.  Physical exam:     General-- white female in no acute distress sitting in a hospital bed Heart-- regular rate and rhythm without murmurs are gallops Lungs--clear Abdomen-- soft and nontender   Assessment: 1. Hematemesis. She is not really drop her hemoglobin much and I suspect she has minimal disease. She probably does need EGD 2. Status post surgery for bleeding ulcer and perforation 3. 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 We have discussed this with the patient and she is agreeable.   Rhea Kaelin JR,Kaydon Husby L 01/25/2013, 3:19 PM      

## 2013-01-25 NOTE — Progress Notes (Addendum)
INITIAL NUTRITION ASSESSMENT  DOCUMENTATION CODES Per approved criteria  -Severe malnutrition in the context of acute illness or injury   INTERVENTION:  Resource Breeze daily (250 kcals, 9 gm protein per 8 fl oz carton) RD to follow for nutrition care plan  NUTRITION DIAGNOSIS: Inadequate oral intake related to decreased appetite, abdominal pain as evidenced by patient report  Goal: Pt to meet >/= 90% of their estimated nutrition needs   Monitor:  PO & supplemental intake, weight, labs, I/O's  Reason for Assessment: Malnutrition Screening Tool Report  47 y.o. female  Admitting Dx: abdominal pain  ASSESSMENT: Patient presented with a 4 day hx of abdominal pain; + vomiting of coffee grounds; s/p EGD in 2008 which showed shallow clean-based ulcer at distal stomach; no active bleeding upon insertion, no source of anemia found and small hiatal hernia.   Patient reports she had not eaten anything since Saturday 7/26; also states she's had a 10 lb weight loss during this period of time (6%) ---> severe for time frame; PO intake 50-100% per flowsheet records; RD to order clear liquid supplement to help meet nutrition needs.  Patient meets criteria for severe malnutrition in the context of acute illness or injury as evidenced by < 50% intake of estimated energy requirement for > 5 days and 6% weight loss in 1 week.  Height: Ht Readings from Last 1 Encounters:  01/24/13 5\' 3"  (1.6 m)    Weight: Wt Readings from Last 1 Encounters:  01/25/13 157 lb 3.2 oz (71.305 kg)    Ideal Body Weight: 115 lb  % Ideal Body Weight: 136%  Wt Readings from Last 10 Encounters:  01/25/13 157 lb 3.2 oz (71.305 kg)  01/24/13 156 lb 14.4 oz (71.169 kg)  05/16/12 161 lb 3.2 oz (73.12 kg)  03/30/12 154 lb 1.6 oz (69.899 kg)  02/29/12 156 lb 4.8 oz (70.897 kg)  02/16/12 153 lb 9.6 oz (69.673 kg)  08/31/07 148 lb (67.132 kg)  08/16/07 147 lb 1 oz (66.707 kg)  07/23/07 146 lb 6.4 oz (66.407 kg)   07/16/07 143 lb 1 oz (64.893 kg)    Usual Body Weight: 167 lb  % Usual Body Weight: 94%  BMI:  Body mass index is 27.85 kg/(m^2).  Estimated Nutritional Needs: Kcal: 1800-2000 Protein: 90-100 gm Fluid: 1.8-2.0 L  Skin: Intact  Diet Order: Clear Liquid  EDUCATION NEEDS: -No education needs identified at this time   Intake/Output Summary (Last 24 hours) at 01/25/13 1508 Last data filed at 01/25/13 1300  Gross per 24 hour  Intake   1140 ml  Output    640 ml  Net    500 ml    Labs:   Recent Labs Lab 01/24/13 1638 01/24/13 1954 01/25/13 0510  NA 136  --  135  K 2.6*  --  3.6  CL 86*  --  95*  CO2 28  --  25  BUN 20  --  22  CREATININE 0.68  --  0.56  CALCIUM 10.1  --  9.3  MG  --  2.1  --   GLUCOSE 110*  --  102*    Scheduled Meds: . pantoprazole (PROTONIX) IV  40 mg Intravenous Q12H  . sodium chloride  3 mL Intravenous Q12H    Continuous Infusions: . sodium chloride 75 mL/hr at 01/24/13 1805    Past Medical History  Diagnosis Date  . Blood transfusion 2008    "related to bleeding ulcer?" (01/24/2013)  . Hypertension   . ANEMIA-IRON  DEFICIENCY 11/22/2006    lowest Hb = 2.6 (!) while in the hospital 10/2006 -  GI bleed related; resolved   . Peptic ulcer disease 11/22/2006    EGD (Dr. Bosie Clos) large, shallow clean-based ulcer at distal stomach. No active bleeding and small hiatal hernia. 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  . Perforated ulcer 07/08/2004    thought to be secondary to NSAID use; s/p Cheree Ditto patch repair (Dr. Carolynne Edouard)  . GERD (gastroesophageal reflux disease)     Past Surgical History  Procedure Laterality Date  . Repair of perforated ulcer  06/2004    Maureen Chatters, RD, LDN Pager #:  [phone] After-Hours Pager #: [phone]

## 2013-01-25 NOTE — H&P (Signed)
INTERNAL MEDICINE TEACHING SERVICE Attending Admission Note  Date: 01/25/2013  Patient name: [name]  Medical record number: [id number]  Date of birth: Jun 03, 1966    I have seen and evaluated [name] and discussed their care with the Residency Team.  Records reviewed. Patient w/ hx epigastric pain with recent heavy use of NSAIDs and coffee ground emesis. She has a hx of PUD and hx of perforation requiring surgery. We will need to look into whether or not she had H pylori and whether or not she was treated.  Her H/H drop was out of proportion to other cell lines.  She is currently hemodynamically stable. Send stool for H pylori. Cont IV BID PPI. No further emesis. Consult GI for EGD.  Repeat H/H this afternoon.    Jonah Blue, [place] 8/1/20142:46 PM

## 2013-01-26 ENCOUNTER — Encounter (HOSPITAL_COMMUNITY)
Admission: AD | Disposition: A | Discharge status: Home / Self Care | Payer: Self-pay | Source: Ambulatory Visit | Attending: Internal Medicine

## 2013-01-26 ENCOUNTER — Encounter (HOSPITAL_COMMUNITY): Payer: Self-pay | Admitting: *Deleted

## 2013-01-26 DIAGNOSIS — K279 Peptic ulcer, site unspecified, unspecified as acute or chronic, without hemorrhage or perforation: Secondary | ICD-10-CM

## 2013-01-26 DIAGNOSIS — E876 Hypokalemia: Secondary | ICD-10-CM

## 2013-01-26 HISTORY — PX: ESOPHAGOGASTRODUODENOSCOPY: SHX5428

## 2013-01-26 LAB — CBC
HCT: 34 % — ABNORMAL LOW (ref 36.0–46.0)
Hemoglobin: 10.5 g/dL — ABNORMAL LOW (ref 12.0–15.0)
Hemoglobin: 11.5 g/dL — ABNORMAL LOW (ref 12.0–15.0)
Hemoglobin: 11.7 g/dL — ABNORMAL LOW (ref 12.0–15.0)
Hemoglobin: 11.9 g/dL — ABNORMAL LOW (ref 12.0–15.0)
MCH: 30.3 pg (ref 26.0–34.0)
MCH: 31.3 pg (ref 26.0–34.0)
MCHC: 33.7 g/dL (ref 30.0–36.0)
MCHC: 33.8 g/dL (ref 30.0–36.0)
MCV: 90.1 fL (ref 78.0–100.0)
MCV: 90.2 fL (ref 78.0–100.0)
MCV: 90.2 fL (ref 78.0–100.0)
Platelets: 380 10*3/uL (ref 150–400)
Platelets: 358 10*3/uL (ref 150–400)
RBC: 3.46 MIL/uL — ABNORMAL LOW (ref 3.87–5.11)
RBC: 3.74 MIL/uL — ABNORMAL LOW (ref 3.87–5.11)
RBC: 3.86 MIL/uL — ABNORMAL LOW (ref 3.87–5.11)
RDW: 14 % (ref 11.5–15.5)
WBC: 7 10*3/uL (ref 4.0–10.5)
WBC: 7.9 10*3/uL (ref 4.0–10.5)

## 2013-01-26 LAB — BASIC METABOLIC PANEL
CO2: 23 mEq/L (ref 19–32)
Chloride: 103 mEq/L (ref 96–112)
Glucose, Bld: 107 mg/dL — ABNORMAL HIGH (ref 70–99)
Potassium: 3.5 mEq/L (ref 3.5–5.1)
Sodium: 138 mEq/L (ref 135–145)

## 2013-01-26 SURGERY — EGD (ESOPHAGOGASTRODUODENOSCOPY)
Anesthesia: Moderate Sedation

## 2013-01-26 MED ORDER — BUTAMBEN-TETRACAINE-BENZOCAINE 2-2-14 % EX AERO
INHALATION_SPRAY | CUTANEOUS | Status: DC | PRN
  Administered 2013-01-26: 2 via TOPICAL

## 2013-01-26 MED ORDER — FENTANYL CITRATE 0.05 MG/ML IJ SOLN
INTRAMUSCULAR | Status: DC | PRN
  Administered 2013-01-26 (×3): 25 ug via INTRAVENOUS

## 2013-01-26 MED ORDER — SODIUM CHLORIDE 0.9 % IV SOLN
INTRAVENOUS | Status: DC
  Administered 2013-01-26: 50 mL via INTRAVENOUS

## 2013-01-26 MED ORDER — SUCRALFATE 1 G PO TABS
1.0000 g | ORAL_TABLET | Freq: Three times a day (TID) | ORAL | Status: DC
  Administered 2013-01-26 – 2013-01-28 (×8): 1 g via ORAL
  Filled 2013-01-26 (×10): qty 1

## 2013-01-26 MED ORDER — MIDAZOLAM HCL 10 MG/2ML IJ SOLN
INTRAMUSCULAR | Status: DC | PRN
  Administered 2013-01-26 (×4): 2 mg via INTRAVENOUS

## 2013-01-26 NOTE — Progress Notes (Signed)
  Date: 01/26/2013  Patient name: [name]  Medical record number: [id number]  Date of birth: Nov 18, 1965   This patient has been seen and the plan of care was discussed with the house staff. Please see their note for complete details. I concur with their findings with the following additions/corrections:  Patient found to have a large antral ulcer in setting of high dose PPI use by EGD today, awaiting path results to rule out neoplasm. Advancing diet to ensure no further bleeding.  Judyann Munson, MD 01/26/2013, 3:06 PM

## 2013-01-26 NOTE — Interval H&P Note (Signed)
History and Physical Interval Note:  01/26/2013 12:52 PM  [name]  has presented today for surgery, with the diagnosis of hematemesis  The various methods of treatment have been discussed with the patient and family. After consideration of risks, benefits and other options for treatment, the patient has consented to  Procedure(s): ESOPHAGOGASTRODUODENOSCOPY (EGD) (N/A) as a surgical intervention .  The patient's history has been reviewed, patient examined, no change in status, stable for surgery.  I have reviewed the patient's chart and labs.  Questions were answered to the patient's satisfaction.     Quame Spratlin JR,Dontavion Noxon L

## 2013-01-26 NOTE — Progress Notes (Signed)
Subjective: Patient doing well. Has not had any nausea or vomiting for last 36 hours.  Tolerates clear liquid well. She has not had BM since admission. NPO now pending EGD today.   Objective: Vital signs in last 24 hours: Filed Vitals:   01/25/13 0700 01/25/13 1430 01/25/13 2109 01/26/13 0515  BP:  122/74 138/87 120/76  Pulse:  81 98 85  Temp:  97.9 F (36.6 C) 98.6 F (37 C) 98.1 F (36.7 C)  TempSrc:  Oral Oral Oral  Resp:  18 18 18   Height:      Weight: 157 lb 3.2 oz (71.305 kg)   157 lb 3 oz (71.3 kg)  SpO2:  98% 96% 97%   Weight change: 1 lb 3 oz (0.539 kg)  Intake/Output Summary (Last 24 hours) at 01/26/13 1155 Last data filed at 01/26/13 0515  Gross per 24 hour  Intake    120 ml  Output    900 ml  Net   -780 ml   General: NAD Lungs: normal respiratory effort, no accessory muscle use, normal breath sounds, no crackles, and no wheezes. Heart: normal rate, regular rhythm, no murmur, no gallop, and no rub.  Abdomen: soft, mid line abdominal scar, Very mild tenderness in the epigastric region, no rebound or guarding, bowel sounds normal; no masses, no organomegaly  Extremities: No cyanosis, clubbing, edema Neurologic: alert & oriented X3, cranial nerves II-XII intact, strength normal in all extremities, sensation intact to light touch, and gait normal.  Skin: turgor normal and no rashes.   Lab Results: Basic Metabolic Panel:  Recent Labs Lab 01/24/13 1954 01/25/13 0510 01/26/13 0620  Dortha Neighbors  --  135 138  K  --  3.6 3.5  CL  --  95* 103  CO2  --  25 23  GLUCOSE  --  102* 107*  BUN  --  22 13  CREATININE  --  0.56 0.48*  CALCIUM  --  9.3 9.0  MG 2.1  --   --    Liver Function Tests:  Recent Labs Lab 01/24/13 1638  AST 13  ALT 11  ALKPHOS 91  BILITOT 0.2*  PROT 8.7*  ALBUMIN 4.5    Recent Labs Lab 01/24/13 1954  LIPASE 13   CBC:  Recent Labs Lab 01/25/13 2256 01/26/13 0620  WBC 8.0 7.0  HGB 11.0* 11.9*  HCT 31.7* 34.7*  MCV 89.3 89.9    PLT 368 380   Coagulation:  Recent Labs Lab 01/24/13 1638  LABPROT 12.4  INR 0.94   Medications: I have reviewed the patient's current medications. Scheduled Meds: . feeding supplement  1 Container Oral Q1500  . pantoprazole (PROTONIX) IV  40 mg Intravenous Q12H  . sodium chloride  3 mL Intravenous Q12H   Continuous Infusions:  PRN Meds:.morphine injection, ondansetron (ZOFRAN) IV, ondansetron Assessment/Plan:  Patient is a 47 year old woman with PMH of PUD and perforation requiring surgery in 2004, who presents to the hospital with acute onset of epigastric pain and coffee ground emesis in a setting of recent heavy use of NSAIDs.   #Hematemesis- resolved  Recent Labs Lab 01/25/13 1700 01/25/13 2256 01/26/13 0620  HGB 11.6* 11.0* 11.9*  HCT 34.2* 31.7* 34.7*  WBC 8.1 8.0 7.0  PLT 393 368 380    - Appreciate GI consult  - Continue IV fluids  - NPO for EGD today - Protonix IV twice a day  - Morphine and Zofran when necessary   #Hypertension  - HCTZ on hold   #  hypokalemia --resolved  # VTE: SCD   Dispo: Disposition is deferred at this time, awaiting improvement of current medical problems.  Anticipated discharge in approximately 1-2 day(s).   The patient does have a current PCP (Neema Davina Poke, MD) and does need an [facility] hospital follow-up appointment after discharge.  The patient does not have transportation limitations that hinder transportation to clinic appointments.  .Services Needed at time of discharge: Y = Yes, Blank = No PT:   OT:   RN:   Equipment:   Other:     LOS: 2 days   Dede Query, MD 01/26/2013, 11:55 AM

## 2013-01-26 NOTE — H&P (View-Only) (Signed)
[facility] CONSULT Reason for consult: hematemesis Referring Physician: medical teaching service  [name] is an 47 y.o. female.  HPI: patient has history of surgery many years ago for perforation peptic ulcer. She came into the clinic yesterday after several days of vague abdominal pain nausea with vomiting of coffee ground material she reports that she has been on omeprazole chronically since her ulcer surgery. She used that he doubters in the past but is not had any recently. Her only NSAIDs use was ibuprofen following oral surgery several weeks ago. She reports that since coming in the hospital she has done better. She had colonoscopy 5 or 6 years ago with diverticulosis, AVM hyperplastic polyp. Her hemoglobin has been over 12 since her admission and she reports that her abdominal pain is better.  Past Medical History  Diagnosis Date  . Blood transfusion 2008    "related to bleeding ulcer?" (01/24/2013)  . Hypertension   . 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 Peptic ulcer disease 11/22/2006    EGD (Dr. Bosie Clos) large, shallow clean-based ulcer at distal stomach. No active bleeding and small hiatal hernia. 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  . Perforated ulcer 07/08/2004    thought to be secondary to NSAID use; s/p Cheree Ditto patch repair (Dr. Carolynne Edouard)  . GERD (gastroesophageal reflux disease)     Past Surgical History  Procedure Laterality Date  . Repair of perforated ulcer  06/2004    Family History  Problem Relation Age of Onset  . Ulcers Father   . GER disease Father     Social History:  reports that she has been smoking Cigarettes.  She has a 15  pack-year smoking history. She has never used smokeless tobacco. She reports that she drinks about 1.2 ounces of alcohol per week. She reports that she does not use illicit drugs.  Allergies: No Known Allergies  Medications; . pantoprazole (PROTONIX) IV  40 mg Intravenous Q12H  . sodium chloride  3 mL Intravenous Q12H   PRN Meds morphine injection, ondansetron (ZOFRAN) IV, ondansetron Results for orders placed during the hospital encounter of 01/24/13 (from the past 48 hour(s))  PREGNANCY, URINE     Status: None   Collection Time    01/24/13  6:10 PM      Result Value Range   Preg Test, Ur NEGATIVE  NEGATIVE   Comment:            THE SENSITIVITY OF THIS     METHODOLOGY IS >20 mIU/mL.  LACTIC ACID, PLASMA     Status: None   Collection Time    01/24/13  7:52 PM      Result Value Range   Lactic Acid, Venous 0.9  0.5 - 2.2 mmol/L  MAGNESIUM     Status: None   Collection Time    01/24/13  7:54 PM      Result Value Range   Magnesium 2.1  1.5 - 2.5 mg/dL  APTT     Status: None   Collection Time    01/24/13  7:54 PM      Result Value Range   aPTT 28  24 - 37 seconds  LIPASE, BLOOD     Status:  None   Collection Time    01/24/13  7:54 PM      Result Value Range   Lipase 13  11 - 59 U/L  CBC     Status: Abnormal   Collection Time    01/25/13 12:01 AM      Result Value Range   WBC 13.7 (*) 4.0 - 10.5 K/uL   RBC 4.15  3.87 - 5.11 MIL/uL   Hemoglobin 12.9  12.0 - 15.0 g/dL   HCT 16.1  09.6 - 04.5 %   MCV 90.1  78.0 - 100.0 fL   MCH 31.1  26.0 - 34.0 pg   MCHC 34.5  30.0 - 36.0 g/dL   RDW 40.9  81.1 - 91.4 %   Platelets 405 (*) 150 - 400 K/uL  BASIC METABOLIC PANEL     Status: Abnormal   Collection Time    01/25/13  5:10 AM      Result Value Range   Sodium 135  135 - 145 mEq/L   Potassium 3.6  3.5 - 5.1 mEq/L   Chloride 95 (*) 96 - 112 mEq/L   CO2 25  19 - 32 mEq/L   Glucose, Bld 102 (*) 70 - 99 mg/dL   BUN 22  6 - 23 mg/dL   Creatinine, Ser 7.82  0.50 - 1.10 mg/dL    Calcium 9.3  8.4 - 95.6 mg/dL   GFR calc non Af Amer >90  >90 mL/min   GFR calc Af Amer >90  >90 mL/min   Comment:            The eGFR has been calculated     using the CKD EPI equation.     This calculation has not been     validated in all clinical     situations.     eGFR's persistently     <90 mL/min signify     possible Chronic Kidney Disease.  CBC     Status: Abnormal   Collection Time    01/25/13  5:10 AM      Result Value Range   WBC 11.5 (*) 4.0 - 10.5 K/uL   RBC 4.04  3.87 - 5.11 MIL/uL   Hemoglobin 12.5  12.0 - 15.0 g/dL   HCT 21.3  08.6 - 57.8 %   MCV 90.3  78.0 - 100.0 fL   MCH 30.9  26.0 - 34.0 pg   MCHC 34.2  30.0 - 36.0 g/dL   RDW 46.9  62.9 - 52.8 %   Platelets 400  150 - 400 K/uL  CBC     Status: Abnormal   Collection Time    01/25/13 11:05 AM      Result Value Range   WBC 9.3  4.0 - 10.5 K/uL   RBC 3.92  3.87 - 5.11 MIL/uL   Hemoglobin 12.2  12.0 - 15.0 g/dL   HCT 41.3 (*) 24.4 - 01.0 %   MCV 90.8  78.0 - 100.0 fL   MCH 31.1  26.0 - 34.0 pg   MCHC 34.3  30.0 - 36.0 g/dL   RDW 27.2  53.6 - 64.4 %   Platelets 392  150 - 400 K/uL    No results found.             Blood pressure 122/74, pulse 81, temperature 97.9 F (36.6 C), temperature source Oral, resp. rate 18, height 5\' 3"  (1.6 m), weight 71.305 kg (157 lb 3.2 oz), SpO2 98.00%.  Physical exam:  General-- white female in no acute distress sitting in a hospital bed Heart-- regular rate and rhythm without murmurs are gallops Lungs--clear Abdomen-- soft and nontender   Assessment: 1. Hematemesis. She is not really drop her hemoglobin much and I suspect she has minimal disease. She probably does need EGD 2. Status post surgery for bleeding ulcer and perforation 3. 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 We have discussed this with the patient and she is agreeable.   Jowanda Heeg JR,Vennela Jutte L 01/25/2013, 3:19 PM

## 2013-01-26 NOTE — Op Note (Signed)
[facility] [address] [place], [address]   ENDOSCOPY PROCEDURE REPORT  PATIENT: [name], [name]  MR#: [id number] BIRTHDATE: Mar 13, 1966 , 47  yrs. old GENDER: Female ENDOSCOPIST:Cayde Held Randa Evens, MD REFERRED BY: Medical Teaching Service PROCEDURE DATE:  01/26/2013 PROCEDURE: EGD with biopsy ASA CLASS: class 2 INDICATIONS:   abdominal pain and hematemesis MEDICATION:    fentanyl 75 mcg versed 8 mg IV TOPICAL ANESTHETIC:    cetacaine spray  DESCRIPTION OF PROCEDURE:   ThePentax adult endoscope was inserted into the esophagus with swallowing. There was a widely patent GE junction without esophagitis, Barrett's esophagus, esophageal masses etc. The stomach was entered and there was no active bleeding of the stomach. Right at the incisura in the antrum was a mass of ulcer estimated to be 4 to 5 cm in diameter. It was not actively bleeding although there were some areas that could have been visible vessels. The edges of the ulcer were biopsy. This was just above the pyloric channel which was easily passed. The duodenum including the bulb and 2nd portion was normal. The scope was withdrawn in the initial findings were confirmed.     COMPLICATIONS: None  ENDOSCOPIC IMPRESSION: 1.Massive Antral Ulcer. It is concerning that this patient developed this ulcer while she was taking omeprazole BID. She is not really had any obvious NSAIDs according to her history. This is quite suspicious for neoplastic ulcer.  RECOMMENDATIONS: 1.continue aggressive PPI therapy 2. Add carafate QID 3. Wait for pathology results    _______________________________ eSigned:  Carman Ching, MD 01/26/2013 1:35 PM

## 2013-01-27 DIAGNOSIS — E43 Unspecified severe protein-calorie malnutrition: Secondary | ICD-10-CM

## 2013-01-27 LAB — CBC
HCT: 33.3 % — ABNORMAL LOW (ref 36.0–46.0)
Hemoglobin: 11.3 g/dL — ABNORMAL LOW (ref 12.0–15.0)
MCH: 30.6 pg (ref 26.0–34.0)
MCHC: 34.2 g/dL (ref 30.0–36.0)
MCV: 90.2 fL (ref 78.0–100.0)
MCV: 89.8 fL (ref 78.0–100.0)
RBC: 3.69 MIL/uL — ABNORMAL LOW (ref 3.87–5.11)
RDW: 13.8 % (ref 11.5–15.5)

## 2013-01-27 LAB — BASIC METABOLIC PANEL
CO2: 24 mEq/L (ref 19–32)
Glucose, Bld: 107 mg/dL — ABNORMAL HIGH (ref 70–99)
Potassium: 3.4 mEq/L — ABNORMAL LOW (ref 3.5–5.1)
Sodium: 139 mEq/L (ref 135–145)

## 2013-01-27 MED ORDER — POTASSIUM CHLORIDE CRYS ER 20 MEQ PO TBCR
40.0000 meq | EXTENDED_RELEASE_TABLET | Freq: Once | ORAL | Status: AC
  Administered 2013-01-27: 40 meq via ORAL
  Filled 2013-01-27: qty 2

## 2013-01-27 NOTE — Progress Notes (Signed)
[facility] PROGRESS NOTE Subjective patient feels well with no abdominal pain and no sign of active bleeding  Objective: Vital signs in last 24 hours: Temp:  [97.7 F (36.5 C)-98 F (36.7 C)] 97.7 F (36.5 C) (08/03 0504) Pulse Rate:  [73-80] 73 (08/03 0504) Resp:  [14-29] 17 (08/03 0504) BP: (103-130)/(57-75) 117/59 mmHg (08/03 0504) SpO2:  [94 %-100 %] 99 % (08/03 0504) Weight:  [72.984 kg (160 lb 14.4 oz)] 72.984 kg (160 lb 14.4 oz) (08/03 0504) Last BM Date: 01/23/13  Intake/Output from previous day: 08/02 0701 - 08/03 0700 In: -  Out: 500 [Urine:500] Intake/Output this shift: Total I/O In: 240 [P.O.:240] Out: -   PE:  Abdomen-- soft and nontender  Lab Results:  Recent Labs  01/26/13 1148 01/26/13 1804 01/26/13 2340 01/27/13 0411 01/27/13 1120  WBC 7.9 8.6 7.8 7.2 7.0  HGB 11.7* 11.5* 10.5* 11.3* 11.4*  HCT 33.7* 34.0* 31.2* 33.3* 33.3*  PLT 358 384 356 358 378   BMET  Recent Labs  01/24/13 1638 01/25/13 0510 01/26/13 0620 01/27/13 0411  NA 136 135 138 139  K 2.6* 3.6 3.5 3.4*  CL 86* 95* 103 106  CO2 28 25 23 24   CREATININE 0.68 0.56 0.48* 0.51   LFT  Recent Labs  01/24/13 1638  PROT 8.7*  AST 13  ALT 11  ALKPHOS 91  BILITOT 0.2*   PT/INR  Recent Labs  01/24/13 1638  LABPROT 12.4  INR 0.94   PANCREAS  Recent Labs  01/24/13 1954  LIPASE 13         Studies/Results: No results found.  Medications: I have reviewed the patient's current medications.  Assessment/Plan: 1. Massive Gastric Ulcer. Her hemoglobin appears stable. She is on PPI BID and Carafate. If there is no sign of bleeding overnight and her hemoglobin is still stable in the a.m., I would go ahead and discharge home on PPI BID and Carafate QID. I would like to see her back in the office in about a month. We will need to arrange repeat EGD to document healing of this ulcer   Dhanvi Boesen JR,Levi Klaiber L 01/27/2013, 12:31 PM

## 2013-01-27 NOTE — Progress Notes (Signed)
Subjective: Feels good.  No complaints.  Denies nausea, vomiting or diarrhea.  Denies abdomen pain. Tolerate mechanical soft diet well without any problem.  Pending discharge in a.m. Objective: Vital signs in last 24 hours: Filed Vitals:   01/26/13 1405 01/26/13 2151 01/27/13 0504 01/27/13 1352  BP: 122/73 125/66 117/59 114/73  Pulse: 80 78 73 73  Temp: 97.9 F (36.6 C) 97.9 F (36.6 C) 97.7 F (36.5 C) 97.8 F (36.6 C)  TempSrc: Oral Oral  Oral  Resp: 16 17 17 20   Height:      Weight:   160 lb 14.4 oz (72.984 kg)   SpO2: 98% 96% 99% 100%   Weight change: 3 lb 11.4 oz (1.684 kg)  Intake/Output Summary (Last 24 hours) at 01/27/13 1827 Last data filed at 01/27/13 1352  Gross per 24 hour  Intake    480 ml  Output    500 ml  Net    -20 ml   General: NAD Lungs: normal respiratory effort, no accessory muscle use, normal breath sounds, no crackles, and no wheezes. Heart: normal rate, regular rhythm, no murmur, no gallop, and no rub.  Abdomen: soft, mid line abdominal scar, Very mild tenderness in the epigastric region, no rebound or guarding, bowel sounds normal; no masses, no organomegaly  Extremities: No cyanosis, clubbing, edema Neurologic: alert & oriented X3, cranial nerves II-XII intact, strength normal in all extremities, sensation intact to light touch, and gait normal.  Skin: turgor normal and no rashes.    Lab Results: Basic Metabolic Panel:  Recent Labs Lab 01/24/13 1954  01/26/13 0620 01/27/13 0411  Dyamond Tolosa  --   < > 138 139  K  --   < > 3.5 3.4*  CL  --   < > 103 106  CO2  --   < > 23 24  GLUCOSE  --   < > 107* 107*  BUN  --   < > 13 13  CREATININE  --   < > 0.48* 0.51  CALCIUM  --   < > 9.0 8.8  MG 2.1  --   --   --   < > = values in this interval not displayed. Liver Function Tests:  Recent Labs Lab 01/24/13 1638  AST 13  ALT 11  ALKPHOS 91  BILITOT 0.2*  PROT 8.7*  ALBUMIN 4.5    Recent Labs Lab 01/24/13 1954  LIPASE 13    CBC:  Recent Labs Lab 01/27/13 0411 01/27/13 1120  WBC 7.2 7.0  HGB 11.3* 11.4*  HCT 33.3* 33.3*  MCV 90.2 89.8  PLT 358 378   Coagulation:  Recent Labs Lab 01/24/13 1638  LABPROT 12.4  INR 0.94   Medications: I have reviewed the patient's current medications. Scheduled Meds: . feeding supplement  1 Container Oral Q1500  . pantoprazole (PROTONIX) IV  40 mg Intravenous Q12H  . sodium chloride  3 mL Intravenous Q12H  . sucralfate  1 g Oral TID WC & HS   Continuous Infusions:  PRN Meds:.morphine injection, ondansetron (ZOFRAN) IV, ondansetron Assessment/Plan: Patient is a 47 year old woman with PMH of PUD and perforation requiring surgery in 2004, who presents to the hospital with acute onset of epigastric pain and coffee ground emesis in a setting of recent heavy use of NSAIDs.   EGD 8/2  1.Massive Antral Ulcer. It is concerning that this patient developed  this ulcer while she was taking omeprazole BID. She is not really  had any obvious NSAIDs according to  her history. This is quite  suspicious for neoplastic ulcer.    #Massive Antral Ulcer      Hematemesis resolved.  Patient doing much better.   - Appreciate GI consult  - continue mechanical soft diet - Protonix IV BID and Carafate - Morphine and Zofran when necessary  - biopsy pathology is pending - Anticipate discharging a.m. If CBC is stable  #Hypertension  - HCTZ on hold   # hypokalemia -- replaced   # VTE: SCD   Dispo: Disposition is deferred at this time, awaiting improvement of current medical problems.  Anticipated discharge in approximately 1 day(s).   The patient does have a current PCP (Neema Davina Poke, MD) and does need an [facility] hospital follow-up appointment after discharge.  The patient does not have transportation limitations that hinder transportation to clinic appointments.  .Services Needed at time of discharge: Y = Yes, Blank = No PT:   OT:   RN:   Equipment:   Other:     LOS:  3 days   Dede Query, MD 01/27/2013, 6:27 PM

## 2013-01-28 ENCOUNTER — Encounter (HOSPITAL_COMMUNITY): Payer: Self-pay | Admitting: Gastroenterology

## 2013-01-28 LAB — BASIC METABOLIC PANEL
BUN: 15 mg/dL (ref 6–23)
CO2: 24 mEq/L (ref 19–32)
Calcium: 8.9 mg/dL (ref 8.4–10.5)
Chloride: 107 mEq/L (ref 96–112)
Creatinine, Ser: 0.54 mg/dL (ref 0.50–1.10)
GFR calc Af Amer: >90 mL/min (ref >90)
GFR calc non Af Amer: >90 mL/min (ref >90)
Glucose, Bld: 110 mg/dL — ABNORMAL HIGH (ref 70–99)
Potassium: 4 mEq/L (ref 3.5–5.1)
Sodium: 141 mEq/L (ref 135–145)

## 2013-01-28 LAB — CBC
HCT: 32.9 % — ABNORMAL LOW (ref 36.0–46.0)
Hemoglobin: 11 g/dL — ABNORMAL LOW (ref 12.0–15.0)
MCH: 30.1 pg (ref 26.0–34.0)
MCHC: 33.4 g/dL (ref 30.0–36.0)
MCV: 89.9 fL (ref 78.0–100.0)
Platelets: 402 10*3/uL — ABNORMAL HIGH (ref 150–400)
RBC: 3.66 MIL/uL — ABNORMAL LOW (ref 3.87–5.11)
RDW: 13.9 % (ref 11.5–15.5)
WBC: 6.7 10*3/uL (ref 4.0–10.5)

## 2013-01-28 MED ORDER — SUCRALFATE 1 G PO TABS: 1.0000 g | ORAL_TABLET | Freq: Four times a day (QID) | ORAL | Status: DC

## 2013-01-28 MED ORDER — PANTOPRAZOLE SODIUM 40 MG PO TBEC: 40.0000 mg | DELAYED_RELEASE_TABLET | Freq: Two times a day (BID) | ORAL | Status: DC

## 2013-01-28 MED ORDER — BOOST / RESOURCE BREEZE PO LIQD: 1.0000 | Freq: Every day | ORAL | Status: DC

## 2013-01-28 NOTE — Progress Notes (Signed)
Subjective:  Pt seen and examined. No acute events overnight. No episodes of emesis. Mild abdominal pain. Had BM yesterday with no blood in stool. No reports of lightheadedness when standing. Ready to go home today.  Objective: Vital signs in last 24 hours: Filed Vitals:   01/27/13 1352 01/27/13 2105 01/28/13 0500 01/28/13 0616  BP: 114/73 125/83  122/80  Pulse: 73 73  69  Temp: 97.8 F (36.6 C) 97.9 F (36.6 C)  97.7 F (36.5 C)  TempSrc: Oral Oral  Oral  Resp: 20 16  16   Height:      Weight:   72.6 kg (160 lb 0.9 oz)   SpO2: 100% 100%  100%   Weight change: -0.384 kg (-13.5 oz)  Intake/Output Summary (Last 24 hours) at 01/28/13 0720 Last data filed at 01/28/13 0616  Gross per 24 hour  Intake   1270 ml  Output      0 ml  Net   1270 ml   General: NAD Lungs: normal respiratory effort, no accessory muscle use, normal breath sounds, no crackles, and no wheezes. Heart: normal rate, regular rhythm, no murmur, no gallop, and no rub.  Abdomen: soft, mid line abdominal scar, Very mild tenderness in the epigastric region, no rebound or guarding, bowel sounds normal; no masses, no organomegaly  Extremities: No cyanosis, clubbing, edema Neurologic: alert & oriented X3, cranial nerves II-XII intact, strength normal in all extremities, sensation intact to light touch, and gait normal.  Skin: turgor normal and no rashes.    Lab Results: Basic Metabolic Panel:  Recent Labs Lab 01/24/13 1954  01/26/13 0620 01/27/13 0411  NA  --   < > 138 139  K  --   < > 3.5 3.4*  CL  --   < > 103 106  CO2  --   < > 23 24  GLUCOSE  --   < > 107* 107*  BUN  --   < > 13 13  CREATININE  --   < > 0.48* 0.51  CALCIUM  --   < > 9.0 8.8  MG 2.1  --   --   --   < > = values in this interval not displayed. Liver Function Tests:  Recent Labs Lab 01/24/13 1638  AST 13  ALT 11  ALKPHOS 91  BILITOT 0.2*  PROT 8.7*  ALBUMIN 4.5    Recent Labs Lab 01/24/13 1954  LIPASE 13   No results  found for this basename: AMMONIA,  in the last 168 hours CBC:  Recent Labs Lab 01/27/13 1120 01/28/13 0545  WBC 7.0 6.7  HGB 11.4* 11.0*  HCT 33.3* 32.9*  MCV 89.8 89.9  PLT 378 402*   Cardiac Enzymes: No results found for this basename: CKTOTAL, CKMB, CKMBINDEX, TROPONINI,  in the last 168 hours BNP: No results found for this basename: PROBNP,  in the last 168 hours D-Dimer: No results found for this basename: DDIMER,  in the last 168 hours CBG: No results found for this basename: GLUCAP,  in the last 168 hours Hemoglobin A1C: No results found for this basename: HGBA1C,  in the last 168 hours Fasting Lipid Panel: No results found for this basename: CHOL, HDL, LDLCALC, TRIG, CHOLHDL, LDLDIRECT,  in the last 168 hours Thyroid Function Tests: No results found for this basename: TSH, T4TOTAL, FREET4, T3FREE, THYROIDAB,  in the last 168 hours Coagulation:  Recent Labs Lab 01/24/13 1638  LABPROT 12.4  INR 0.94   Anemia Panel: No  results found for this basename: VITAMINB12, FOLATE, FERRITIN, TIBC, IRON, RETICCTPCT,  in the last 168 hours Urine Drug Screen: Drugs of Abuse  No results found for this basename: labopia, cocainscrnur, labbenz, amphetmu, thcu, labbarb    Alcohol Level: No results found for this basename: ETH,  in the last 168 hours Urinalysis: No results found for this basename: COLORURINE, APPERANCEUR, LABSPEC, PHURINE, GLUCOSEU, HGBUR, BILIRUBINUR, KETONESUR, PROTEINUR, UROBILINOGEN, NITRITE, LEUKOCYTESUR,  in the last 168 hours Misc. Labs:  Micro Results: No results found for this or any previous visit (from the past 240 hour(s)). Studies/Results: No results found. Medications: I have reviewed the patient's current medications. Scheduled Meds: . feeding supplement  1 Container Oral Q1500  . pantoprazole (PROTONIX) IV  40 mg Intravenous Q12H  . sodium chloride  3 mL Intravenous Q12H  . sucralfate  1 g Oral TID WC & HS   Continuous Infusions:  PRN  Meds:.morphine injection, ondansetron (ZOFRAN) IV, ondansetron Assessment/Plan: Patient is a 47 year old woman with PMH of PUD and perforation requiring surgery in 2004, who presents to the hospital with acute onset of epigastric pain and coffee ground emesis in a setting of recent heavy use of NSAIDs.    #Massive Antral Ulcer - Hematemesis resolved, no active bleeding. Most likely due to h/o PUD with recent 2-3 weeks NSAID use. Also concern for malignancy.   - Appreciate GI consult - d/c today if H/H stable  - Continue mechanical soft diet  - Protonix IV BID and Carafate  - Morphine and Zofran when necessary  - Biopsy pathology is pending  - Discharge today - H/H is stable - Will need to see Dr. Randa Evens in 2-4 weeks and repeat endoscopy in 6 months    #Hypertension  - HCTZ on hold   # Hypokalemia -- replaced   # VTE: SCD   Dispo: Anticipated discharge is today.   The patient does have a current PCP (Neema Davina Poke, MD) and does need an [facility] hospital follow-up appointment after discharge.  The patient does have transportation limitations that hinder transportation to clinic appointments.  .Services Needed at time of discharge: Y = Yes, Blank = No PT:   OT:   RN:   Equipment:   Other:     LOS: 4 days   Otis Brace, MD 01/28/2013, 7:20 AM

## 2013-01-28 NOTE — Progress Notes (Signed)
Pt was given discharge instructions and education on ulcers. Pt asked appropriate questions. Rx given. Pt also spoke with Dr. Johna Roles prior to being discharged. Pt reports no pain and stated, "I have been ready to get out of hear." Pt smiling and pleasant. Pt discharged.

## 2013-01-28 NOTE — Progress Notes (Signed)
Subjective: No further bleeding. Abdominal pain much improved. Tolerating diet.  Objective: Vital signs in last 24 hours: Temp:  [97.7 F (36.5 C)-97.9 F (36.6 C)] 97.7 F (36.5 C) (08/04 0616) Pulse Rate:  [69-73] 69 (08/04 0616) Resp:  [16-20] 16 (08/04 0616) BP: (114-125)/(73-83) 122/80 mmHg (08/04 0616) SpO2:  [100 %] 100 % (08/04 0616) Weight:  [72.6 kg (160 lb 0.9 oz)] 72.6 kg (160 lb 0.9 oz) (08/04 0500) Weight change: -0.384 kg (-13.5 oz) Last BM Date: 01/27/13  PE: GEN:  NAD  Lab Results: CBC    Component Value Date/Time   WBC 6.7 01/28/2013 0545   RBC 3.66* 01/28/2013 0545   HGB 11.0* 01/28/2013 0545   HCT 32.9* 01/28/2013 0545   PLT 402* 01/28/2013 0545   MCV 89.9 01/28/2013 0545   MCH 30.1 01/28/2013 0545   MCHC 33.4 01/28/2013 0545   RDW 13.9 01/28/2013 0545   LYMPHSABS 2.1 02/14/2012 1051   MONOABS 1.4* 02/14/2012 1051   EOSABS 0.4 02/14/2012 1051   BASOSABS 0.0 02/14/2012 1051   Assessment:  1.  Gastric ulcer with bleeding, clinically resolved.  Plan:  1.  BID PPI and carafate upon discharge, continuing through time of follow-up with Dr. Randa Evens. 2.  Have patient call [facility] [phone]) to set up follow-up office visit with Dr. Randa Evens in the next few weeks. 3.  Patient needs repeat endoscopy in 6-8 months to document ulcer healing. 4.  Will sign off; please call with questions.   Bette Brienza M 01/28/2013, 8:16 AM

## 2013-01-28 NOTE — Progress Notes (Signed)
  Date: 01/28/2013  Patient name: [name]  Medical record number: [id number]  Date of birth: 02-Jun-1966   This patient has been seen and the plan of care was discussed with the house staff. Please see their note for complete details. I concur with their findings with the following additions/corrections:  Clinically improved. Will need to F/U with GI as outpatient to obtain results of biopsy. She does admit to recent heavy NSAID use due to recent dentistry procedure.  Cont BID PPI on D/C. She will need repeat EGD in 6 months to re-examine ulcer site for healing. Medically stable for D/C.  Jonah Blue, DO 01/28/2013, 2:15 PM

## 2013-01-28 NOTE — Discharge Summary (Signed)
Name: [name] MRN: [id number] DOB: 04-Apr-1966 47 y.o. PCP: Belia Heman, MD  Date of Admission: 01/24/2013  5:17 PM Date of Discharge: 01/28/2013 Attending Physician: Jonah Blue, DO  Discharge Diagnosis: 1.Principal Problem:   Hematemesis Active Problems:   HYPERTENSION   Protein-calorie malnutrition, severe   Hypokalemia  Discharge Medications:   Medication List         feeding supplement Liqd  Take 1 Container by mouth daily at 3 pm.     hydrochlorothiazide 25 MG tablet  Commonly known as:  HYDRODIURIL  Take 1 tablet (25 mg total) by mouth daily.     omeprazole 40 MG capsule  Commonly known as:  PRILOSEC  Take 1 capsule (40 mg total) by mouth 2 (two) times daily.     sucralfate 1 G tablet  Commonly known as:  CARAFATE  Take 1 tablet (1 g total) by mouth 4 (four) times daily.        Disposition and follow-up:   [name] was discharged from [facility] in stable condition.  At the hospital follow up visit please address:  1.  - Due to recent UGI bleed with anemia, repeat CBC,  consider iron study and iron supplementation if needed      - Compliance with daily PPI and  carafate  therapy and avoiding use of NSAIDs       - Recheck potassium, was hypokalemic on admission which improved with supplementation       -Assess nutritional status    2.  Labs / imaging needed at time of follow-up: CBC, CMP   3.  Pending labs/ test needing follow-up:  Pathology results of EGD biopsy done on 01/26/13  Follow-up Appointments:     Follow-up Information   Follow up with Vertell Novak, MD On 02/20/2013. (2:45PM, arrive 2:30PM )    Contact information:   [address]                         [place] [address] [phone]       Follow up with Dow Adolph, MD On 02/04/2013. (1:45pm, arrive at 1:30pm)    Contact information:   [address] [place] [address] [phone]        Discharge Instructions:  Future Appointments Provider Department Dept Phone   02/04/2013 1:45 PM Dow Adolph, MD [facility] [phone]      Consultations: Dr. Carman Ching   Procedures Performed: EGD on 01/26/13  No results found.  2D Echo: none  Cardiac Cath: none  Admission HPI: Original Tara Azure, MD  Patient Presented with a 4 day hx of abd pain, in the epigastric region,intermittent, non radiating, though she said she has some back pain, directly posterior to the site of the abd pain, rates the pain as a 7/10, aggrav by food intake, relieved by alka saltser and by vomiting. Vomitus was desrcibed as mostly coffee grounds- dark,last coffee grounds- yesterday,but has been clear all day today, 3-4 episodes a day since Sunday. She has not had a bowel movement for the past two days, though she reports hardly any food intake since Sunday, no episodes of diarrhoea, and stools have been normal colour. She also reports having some dizziness when she stands up initially and weakness with activity.  She reports she has lost 10 pounds in the past week, due to poor PO intake. She has  a hx of PUD, had her first episode- 2004, with perforation, for which she had an abdominal surg. She was taking motrin, approximately 1-2 tablets twice a day, about 3-4 times a week for about 2-3 weeks prior to abd pain. Other medications are omeprazole and HCT. She smokes-1/2 a pack a day for the past 30years, takes alcohol occasionally. Her last EGD was in 2008- by Dr. Bosie Clos- large, shallow clean-based ulcer at distal stomach. No active bleeding upon insertion, no source of anemia found and small hiatal hernia , then she was transfused with 4 units of blood for acute on chronic blood Loss.    Hospital Course by problem list: Principal Problem:   Hematemesis Active Problems:   HYPERTENSION   Protein-calorie malnutrition, severe   Hypokalemia   1. Hematemesis -   Pt with history of GERD and PUD complicated by perforated peptic ulcer in 2006 requiring surgical intervention and last EGD in 2008 revealing large shallow clean based ulcer at distal stomach who is on chronic PPI therapy with 3 week history of NSAID use due to recent dental pain from dental work. She presented with coffee ground hematemesis and epigastric pain. She was hemodynamically stable on admission and remained so throughout hospilization  with downtrending H/H of 14.9/42.8 on admission  to low of 03/31/30.2 i . GI (Dr. Carman Ching)  was consulted who performed EGD on 01/26/13 which revealed massive antral ulcer.  Pathology results were pending at time of  discharge.  There was concern for neoplastic ulcer.  Throughout hospitalization patient was continued on IV PPI therapy with addition of carafate therapy after EGD procedure with resolution of hematemesis  and  improvement of  epigastric pain. She was hemodynamically stable on discharge with H/H of 11.3/32.9. Per GI to see patient in 2-4 weeks with repeat endoscopy in 6-8 months to document healing of antral ulcer.    2. Hypertension - Blood pressure throughout hospitalization was stable and ranged from 117/59-142/87. Her home diureticc HCTZ was held during hospitalization.  3. Hypokalemia - Patient was hypokalemic (2.6) on admission with improvement with IV and oral potassium supplementation ( )  to 4.0 on discharge. Magnsium levels (2.1) were within normal. Most likely due to aldosterone effect from hypovolemia in context of decreased PO intake and vomiting (concomitant hypochloremia and mild alkaloosis (HCO3 28) on admission.   4. Protein-calorie malnutrition - Per dietician with severe malnutrition due to decreased PO intake with 10lb weight loss. Albumin was normal (4.5) and total protein was high (8.7) on admission. Pt received feeding supplementation per dietician  recommendation and was discharged on feeding supplement.    Discharge  Vitals:   BP 122/80  Pulse 69  Temp(Src) 97.7 F (36.5 C) (Oral)  Resp 16  Ht 5\' 3"  (1.6 m)  Wt 72.6 kg (160 lb 0.9 oz)  BMI 28.36 kg/m2  SpO2 100%  LMP 01/12/2013  Discharge Labs:  Results for orders placed during the hospital encounter of 01/24/13 (from the past 24 hour(s))  CBC     Status: Abnormal   Collection Time    01/28/13  5:45 AM      Result Value Range   WBC 6.7  4.0 - 10.5 K/uL   RBC 3.66 (*) 3.87 - 5.11 MIL/uL   Hemoglobin 11.0 (*) 12.0 - 15.0 g/dL   HCT 40.9 (*) 81.1 - 91.4 %   MCV 89.9  78.0 - 100.0 fL   MCH 30.1  26.0 - 34.0 pg   MCHC 33.4  30.0 - 36.0  g/dL   RDW 16.1  09.6 - 04.5 %   Platelets 402 (*) 150 - 400 K/uL  BASIC METABOLIC PANEL     Status: Abnormal   Collection Time    01/28/13  5:45 AM      Result Value Range   Sodium 141  135 - 145 mEq/L   Potassium 4.0  3.5 - 5.1 mEq/L   Chloride 107  96 - 112 mEq/L   CO2 24  19 - 32 mEq/L   Glucose, Bld 110 (*) 70 - 99 mg/dL   BUN 15  6 - 23 mg/dL   Creatinine, Ser 4.09  0.50 - 1.10 mg/dL   Calcium 8.9  8.4 - 81.1 mg/dL   GFR calc non Af Amer >90  >90 mL/min   GFR calc Af Amer >90  >90 mL/min    Signed: Otis Brace, MD 01/28/2013, 2:05 PM   Time Spent on Discharge: 45 minutes

## 2013-02-04 ENCOUNTER — Ambulatory Visit (INDEPENDENT_AMBULATORY_CARE_PROVIDER_SITE_OTHER): Payer: Self-pay | Admitting: Internal Medicine

## 2013-02-04 ENCOUNTER — Encounter: Payer: Self-pay | Admitting: Internal Medicine

## 2013-02-04 ENCOUNTER — Ambulatory Visit: Payer: Self-pay | Admitting: Internal Medicine

## 2013-02-04 VITALS — BP 121/83 | HR 83 | Temp 97.8°F | Wt 163.4 lb

## 2013-02-04 DIAGNOSIS — E43 Unspecified severe protein-calorie malnutrition: Secondary | ICD-10-CM

## 2013-02-04 DIAGNOSIS — R1013 Epigastric pain: Secondary | ICD-10-CM

## 2013-02-04 DIAGNOSIS — K279 Peptic ulcer, site unspecified, unspecified as acute or chronic, without hemorrhage or perforation: Secondary | ICD-10-CM

## 2013-02-04 DIAGNOSIS — K920 Hematemesis: Secondary | ICD-10-CM

## 2013-02-04 DIAGNOSIS — D509 Iron deficiency anemia, unspecified: Secondary | ICD-10-CM

## 2013-02-04 DIAGNOSIS — G5600 Carpal tunnel syndrome, unspecified upper limb: Secondary | ICD-10-CM

## 2013-02-04 DIAGNOSIS — G5601 Carpal tunnel syndrome, right upper limb: Secondary | ICD-10-CM | POA: Insufficient documentation

## 2013-02-04 LAB — CBC
HCT: 34.5 % — ABNORMAL LOW (ref 36.0–46.0)
Hemoglobin: 11.3 g/dL — ABNORMAL LOW (ref 12.0–15.0)
MCV: 88.2 fL (ref 78.0–100.0)
RBC: 3.91 MIL/uL (ref 3.87–5.11)
RDW: 15 % (ref 11.5–15.5)
WBC: 8.8 10*3/uL (ref 4.0–10.5)

## 2013-02-04 NOTE — Assessment & Plan Note (Signed)
Patient reports that she is taking the resource breeze, nutritional supplements due to as nasty taste. I have counseled her to increase her protein intake as she is at an increased risk of protein malnutrition. We will do a CMP today

## 2013-02-04 NOTE — Assessment & Plan Note (Signed)
Patient symptoms of hematemesis of not recurred since discharge. No repeat use of Goody powders. Physical exam does not reveal anemia. Path report reveals chronic active gastritis, negative for Helicobacter pylori, negative for dysplasia or malignancy. Appointment with Dr. Randa Evens of GI on 02/20/2013. Plan CBC for H/H F/u with GI as above.

## 2013-02-04 NOTE — Discharge Summary (Signed)
  Date: 02/04/2013  Patient name: [name]  Medical record number: [id number]  Date of birth: April 16, 1966   This patient has been seen and the plan of care was discussed with the house staff. Please see their note for complete details. I concur with their findings and plan.  Jonah Blue, DO 02/04/2013, 2:58 PM

## 2013-02-04 NOTE — Patient Instructions (Signed)
It is likely, that your pain in the right hand is due to carpal tunnel syndrome. We will try splint on the right hand every night for the next 2 weeks and see if this helps  We will do blood checks today, and I'll contact you with the result is they are concerning, otherwise, we will discuss them when you come back Please followup after 2 weeks for reevaluation. I encourage you to increase your protein intake with eggs, fish and beef.    Carpal Tunnel Syndrome The carpal tunnel is an area under the skin of the palm of your hand. Nerves, blood vessels, and strong tissues (tendons) pass through the tunnel. The tunnel can become puffy (swollen). If this happens, a nerve can be pinched in the wrist. This causes carpal tunnel syndrome.  HOME CARE  Take all medicine as told by your doctor.  If you were given a splint, wear it as told. Wear it at night or at times when your doctor told you to.  Rest your wrist from the activity that causes your pain.  Put ice on your wrist after long periods of wrist activity.  Put ice in a plastic bag.  Place a towel between your skin and the bag.  Leave the ice on for 15-20 minutes, 3-4 times a day.  Keep all doctor visits as told. GET HELP RIGHT AWAY IF:  You have new problems you cannot explain.  Your problems get worse and medicine does not help. MAKE SURE YOU:   Understand these instructions.  Will watch your condition.  Will get help right away if you are not doing well or get worse. Document Released: 06/02/2011 Document Revised: 09/05/2011 Document Reviewed: 06/02/2011 [facility] Patient Information 2014 [place], [place].

## 2013-02-04 NOTE — Assessment & Plan Note (Signed)
Clinical history and physical examination findings are most consistent with carpal tunnel syndrome. I do not suspect cervical radiculopathy given that the symptoms only limited to the digits. She does not have neck tenderness on exam.  Plan  Right wrist Splint for the next 2 weeks  Provided materials about CTS  If the splint does not relieve her pain, we will consider doing nerve conduction studies.  Follow up in one week

## 2013-02-04 NOTE — Progress Notes (Signed)
Patient ID: [name], female   DOB: Oct 05, 1965, 47 y.o.   MRN: [id number]          HPI: [name] Friend is a 61 y.o. with past medical history of peptic ulcer disease, with a history of ulcer, perforation requiring surgery, hypertension, recent history of hematemesis. She is here for a hospital followup visit. She is complaining of right hand pain since she was discharged from the hospital 2 weeks ago.   PUD: She was admitted from 01/24/2013 to 01/28/2013 due to hematemesis and an EGD on 01/26/2013 revealed an antral ulcer. Since she returned home, her abdominal pain has significantly improved. She reports compliance with her PPI and Carafate. She denies repeat use of Goody powders. She has not had any hematemesis, even though 3 days ago she had some vomiting, which was clear fluid. She reports that a few days after returning home, she continued having dark stools, but that has resolved. She has good bowel movements. No urinary symptoms. Appetite has been good without fevers, chills or fatigue. Discharge hemoglobin 11.2. Her biopsy results are now available indicating chronic active gastritis with associated fibrinopurulent material. No evidence of Helicobacter pylori, no dysplasia or malignancy. She has a followup appointment with Dr. Randa [name] on 02/20/2013. She is aware of this appointment.  Right hand pain: She reports that since she was discharged home, she developed right hand pain, mainly involving the thumb, index, and middle fingers and the wrist. She reports tingling, and numbness of the first and second finger tips. She reports the pain is significant and during the night. No aggravating or relieving factors. She does not use any home remedies to control his pain. She reports weakness with that hand and she recently started dropping objects due to weakness. She denies similar symptoms in the left hand. She does not have any numbness or weakness in her other extremities. No headaches.  She does not have neck pain. This is the first time that she has experienced these symptoms. Her symptoms seem to improve during day time. She is unclear whether the increased with repetitive use of the hind.    Past Medical History  Diagnosis Date  . Blood transfusion 2008    "related to bleeding ulcer?" (01/24/2013)  . Hypertension   . 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 Peptic ulcer disease 11/22/2006    EGD (Dr. Bosie [name]) large, shallow clean-based ulcer at distal stomach. No active bleeding and small hiatal hernia. 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  . Perforated ulcer 07/08/2004    thought to be secondary to NSAID use; s/p [name] patch repair (Dr. Carolynne [name])  . GERD (gastroesophageal reflux disease)    Current Outpatient Prescriptions  Medication Sig Dispense Refill  . hydrochlorothiazide (HYDRODIURIL) 25 MG tablet Take 1 tablet (25 mg total) by mouth daily.  30 tablet  2  . omeprazole (PRILOSEC) 40 MG capsule Take 1 capsule (40 mg total) by mouth 2 (two) times daily.  60 capsule  3  . sucralfate (CARAFATE) 1 G tablet Take 1 tablet (1 g total) by mouth 4 (four) times daily.  120 tablet  1  . feeding supplement (RESOURCE BREEZE) LIQD Take 1 Container by mouth daily at  3 pm.  30 Container  0   No current facility-administered medications for this visit.   Family History  Problem Relation Age of Onset  . Ulcers Father   . GER disease Father    History   Social History  . Marital Status: Married    Spouse Name: [name]    Number of Children: 0  . Years of Education: 12th   Occupational History  . Caregiver    Social History Main Topics  . Smoking status:  Current Every Day Smoker -- 0.50 packs/day for 30 years    Types: Cigarettes  . Smokeless tobacco: Never Used  . Alcohol Use: 1.2 oz/week    2 Glasses of wine per week     Comment: 01/24/2013 "glass of wine maybe 2 times/wk"  . Drug Use: No  . Sexually Active: Yes   Other Topics Concern  . None   Social History Narrative  . None    Review of Systems: Constitutional: Denies fever, chills, diaphoresis, appetite change and fatigue.  Respiratory: Denies SOB, DOE, cough, chest tightness, and wheezing.  Cardiovascular: No chest pain, palpitations and leg swelling.  Genitourinary: No dysuria, frequency, hematuria, or flank pain.     Objective:  Physical Exam: Filed Vitals:   02/04/13 1542  BP: 121/83  Pulse: 83  Temp: 97.8 F (36.6 C)  TempSrc: Oral  Weight: 163 lb 6.4 oz (74.118 kg)  SpO2: 99%   General: Appears weak but in no acute distress.  Lungs: CTA bilaterally. Heart: RRR; no extra sounds or murmurs  Abdomen: Non-distended, normal BS, soft, nontender; no hepatosplenomegaly. Midline supra-umblical surgical scar.  Extremities: No pedal edema. No joint swelling or tenderness. Right hand: The power is 4/5 on grip. Reduced sensation to light touch and prick involving the tips of the thumb, first and second fingers. Phannel and Tinel signs are both positive. Normal reflexes. No thenal atrophy.  Left hand: Normal exam. Neurologic: Alert and oriented x3. No obvious neurologic deficits.  Assessment & Plan:  I have discussed my assessment and plan  with Dr. Aundria Rud as detailed under problem based charting.

## 2013-02-04 NOTE — Assessment & Plan Note (Signed)
Will order iron studies give her risk for IDA. Last Hb was 11.4 on 01/28/2013.

## 2013-02-05 LAB — COMPLETE METABOLIC PANEL WITH GFR
AST: 10 U/L (ref 0–37)
Albumin: 3.8 g/dL (ref 3.5–5.2)
Alkaline Phosphatase: 74 U/L (ref 39–117)
BUN: 13 mg/dL (ref 6–23)
Potassium: 4.2 mEq/L (ref 3.5–5.3)
Total Bilirubin: 0.2 mg/dL — ABNORMAL LOW (ref 0.3–1.2)

## 2013-02-05 LAB — IRON AND TIBC
%SAT: 3 % — ABNORMAL LOW (ref 20–55)
TIBC: 413 ug/dL (ref 250–470)

## 2013-02-05 LAB — FERRITIN: Ferritin: 2 ng/mL — ABNORMAL LOW (ref 10–291)

## 2013-02-05 NOTE — Progress Notes (Signed)
Case discussed with Dr. Kazibwe soon after the resident saw the patient.  We reviewed the resident's history and exam and pertinent patient test results.  I agree with the assessment, diagnosis, and plan of care documented in the resident's note. 

## 2013-05-02 ENCOUNTER — Other Ambulatory Visit: Payer: Self-pay

## 2013-07-19 ENCOUNTER — Encounter: Payer: Self-pay | Admitting: Internal Medicine

## 2014-04-11 ENCOUNTER — Other Ambulatory Visit: Payer: Self-pay

## 2015-01-01 DIAGNOSIS — Z7689 Persons encountering health services in other specified circumstances: Secondary | ICD-10-CM

## 2020-03-22 NOTE — Progress Notes (Signed)
New Patient Office Visit  Subjective:  Patient ID: [name], female    DOB: 1966-02-01  Age: 54 y.o. MRN: [id number]  CC:  Chief Complaint  Patient presents with  . Hypertension    HPI [name] is a 46yof with hypertension, PUD, and iron deficiency anemia who presents for establishment of care.  Pt notes that she received regular health care up until 2004 after undergoing frequent hospitalization for PUD that resulted in a perforation requiring surgical intervention. She developed several complications following this. After that time, she had elected to forgo medical care as she was tired of being hospitalized. She has not sought medical evaluation since that time up until today.  She notes a history of hypertension and was previously treated with hctz however discontinued this over 10 years ago. She notes that she checks her blood pressure at home and it has been slowly increasing over the past 12 months. Recently it has been ranging in the 150s-160s for the most part but is occasionally up into the 190s. She denies any symptoms that result from this.  Importantly, pt notes that she had 2 prior episodes in 2020 that she believes were strokes. She notes that they occurred in July and August. The first time, she remembered waking up and getting up to use the bathroom however fell because of weakness in one of her lower extremities. She had been incontinent of bowel and bladder at that time. Within a few hours, symptoms resolved and she never sought medical evaluation. The second episode occurred about a month later at which time she developed slurred speech, left facial numbness, and paralysis of the left upper and lower extremities. She never sought out medical evaluation at that time either.  Over the past year, she has been working on self rehabilitating. She notes that she LLE has improved significantly to the extend that she barely has a limp anymore. Unfortunately, she has not  regained any function of her left upper extremity.  While sitting in the office today, she shows me that she has to open and close the left hand with her right hand. She notes she has no sensation of the left arm as well.    Past Medical History:  Diagnosis Date  . 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 Blood transfusion 2008   "related to bleeding ulcer?" (01/24/2013)  . BREAST LUMP 08/31/2007   Negative Diagnostic mammo & [place] in 02/2012; Patient to have annual screening mammo.   [name] GERD (gastroesophageal reflux disease)   . Hypertension   . Peptic ulcer disease 11/22/2006   EGD (Dr. Michail Sermon) large, shallow clean-based ulcer at distal stomach. No active bleeding and small hiatal hernia. 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  . Perforated ulcer ([place]) 07/08/2004   thought to be secondary to NSAID use; s/p Phillip Heal patch repair (Dr. Marlou Starks)    Past Surgical History:  Procedure Laterality Date  . ESOPHAGOGASTRODUODENOSCOPY N/A 01/26/2013   Procedure: ESOPHAGOGASTRODUODENOSCOPY (EGD);  Surgeon: Winfield Cunas., MD;  Location: [facility] ENDOSCOPY;  Service: Endoscopy;  Laterality: N/A;  . REPAIR OF PERFORATED ULCER  06/2004    Family History  Problem Relation Age of Onset  . Ulcers  Father   . GER disease Father     Social History   Socioeconomic History  . Marital status: Married    Spouse name: [name]  . Number of children: 0  . Years of education: 12th  . Highest education level: Not on file  Occupational History  . Occupation: Caregiver  Tobacco Use  . Smoking status: Current Every Day Smoker    Packs/day: 0.50    Years: 30.00    Pack years: 15.00    Types: Cigarettes    . Smokeless tobacco: Never Used  Substance and Sexual Activity  . Alcohol use: Yes    Alcohol/week: 2.0 standard drinks    Types: 2 Glasses of wine per week    Comment: 01/24/2013 "glass of wine maybe 2 times/wk"  . Drug use: No  . Sexual activity: Yes  Other Topics Concern  . Not on file  Social History Narrative  . Not on file    ROS Review of Systems  Constitutional: Negative for chills and fever.  Respiratory: Positive for shortness of breath and wheezing. Negative for cough and chest tightness.   Cardiovascular: Positive for leg swelling. Negative for chest pain.  Gastrointestinal: Positive for abdominal pain.  Genitourinary: Positive for urgency. Negative for dysuria and frequency.  Musculoskeletal: Negative.   Skin: Negative.   Neurological: Positive for weakness, light-headedness and numbness. Negative for dizziness, syncope, speech difficulty and headaches.  Hematological: Negative.   Psychiatric/Behavioral: Negative.     Objective:   Today's Vitals: BP (!) 162/103 (BP Location: Right Arm, Patient Position: Sitting, Cuff Size: Small)   Pulse 100   Temp 98.3 F (36.8 C) (Oral)   Ht '5\' 3"'  (1.6 m)   Wt 210 lb 9.6 oz (95.5 kg)   SpO2 98%   BMI 37.31 kg/m   Physical Exam Constitutional:      General: She is not in acute distress.    Appearance: She is obese.     Comments: Chronically ill appearing  HENT:     Head: Normocephalic and atraumatic.  Cardiovascular:     Rate and Rhythm: Normal rate and regular rhythm.  Pulmonary:     Effort: Pulmonary effort is normal.     Comments: Prolonged expiration Abdominal:     General: Bowel sounds are normal.  Musculoskeletal:        General: No tenderness or signs of injury.     Cervical back: Normal range of motion.     Right lower leg: No edema.     Left lower leg: No edema.  Skin:    General: Skin is warm and dry.  Neurological:     Comments: A/o x3. EOMs intact. Face symmetric. Tongue midline. Decreased  sensation of the upper and lower portions of the left face.  LUE strength including biceps 2/5. Diminished sensation throughout. 0/5 strength of the left hand.  Remainder of exam unremarkable.  Psychiatric:        Mood and Affect: Mood normal.     Assessment & Plan:   Problem List Items Addressed This Visit      Cardiovascular and Mediastinum   Essential hypertension - Primary    Blood pressure is above goal in the office today. 162/103. Previously treated with hctz however has not been taking this for several years.  She notes she eats quite a few vegetables. Sedentary lifestyle. Discussed lifestyle adjustments. Assessment: Given the degree of elevation in blood pressure above goal, in addition to 2 events that were likely strokes, I think  it is reasonable to start treating agressively. Unfortunately, she has financial constraints that will limit what she is able to afford today as she is uninsured BMP today showing normal renal function and electrolyte status Plan --start 90m losartan daily --start hctz 25 mg daily --f/u in 4w for hypertension recheck and repeat BMP      Relevant Medications   hydrochlorothiazide (MICROZIDE) 12.5 MG capsule   losartan (COZAAR) 25 MG tablet   Other Relevant Orders   CMP14 + Anion Gap (Completed)   Lipid panel (Completed)   POC Hbg A1C (Completed)     Other   History of stroke    Pt reports 2 episodes that occurred in 2020. She did not seek medical evaluation at that time.  She has fairly significant residual deficits of her LUE as well as left facial numbness.  Risk factors: uncontrolled hypertension, smoking, obeisity, prediabetes  Plan: will need to be aggressive with risk factor modification. Pt was enthusiastic when we discussed these things. Will ultimately need to be placed on a statin however we are limited at today's visit due to financial constraints. She would ideally have antiplatelet therapy however I am hesitant about starting  aspirin today, given her history of PUD resulting in perforation back in 2014. Plavix is not feasible today due to those financial constraints. Will need to continue to discuss risks and benefits to antiplatelet therapy in the future.      Prediabetes    A1C 6.0 today. Discussed lifestyle modifications in detail.  Repeat A1C in 12 months.      Polycythemia    Hgb 16.5 noted on today's labs. Possibly related to her chronic tobacco use. Alk phos is very mildly above the ULN which may suggest dehydration resulting in hemoconcentration. No further workup at this time but will need continued monitoring.      Financial difficulties    Currently uninsured. She is working on getting the orange card and has appt with financial counselor next week.  Referral placed to CCM for further assistance.      Relevant Orders   Ambulatory referral to Chronic Care Management Services   Dyspnea on exertion    Pt notes dyspnea on exertion. This has been going on for several years. She denies cough or frequent nighttime awakenings.  Suspect she has a component of COPD given her extensive smoking history ~40 pack year history. Evaluation and treatment are limited due to financial constraints at today's visit however would consider obtaining PFTs once feasible.      Urge incontinence    Pt notes incontinence intermittently when going from sitting to standing. This has been present since her strokes last year. She otherwise has control of her bladder and denies dysuria or other abnormalities. Will need further discussion at a future visit.      Hyperlipidemia with target LDL less than 70    Lipid Panel     Component Value Date/Time   CHOL 283 (H) 03/23/2020 1526   TRIG 227 (H) 03/23/2020 1526   HDL 43 03/23/2020 1526   CHOLHDL 6.6 (H) 03/23/2020 1526   LDLCALC 196 (H) 03/23/2020 1526   LABVLDL 44 (H) 03/23/2020 1526   Plan: High risk ASCVD. Will need lipid lowering therapy however financial  constraints are barring prescription today per pt request. Please reassess financial status at next office visit and consider starting high intensity statin therapy at that time.      Relevant Medications   hydrochlorothiazide (MICROZIDE) 12.5 MG capsule  losartan (COZAAR) 25 MG tablet   RESOLVED: ANEMIA-IRON DEFICIENCY    Obtained a CBC today to evaluate for her history iron deficiency anemia. Hgb is 16.5 today.      Relevant Orders   CBC no Diff (Completed)     **Pt contacted regarding lab results, medication changes and need for f/u in 4 weeks. She expressed understanding and has no questions at this time.**  Outpatient Encounter Medications as of 03/23/2020  Medication Sig  . hydrochlorothiazide (MICROZIDE) 12.5 MG capsule Take 2 capsules (25 mg total) by mouth daily. IM PROGRAM  . losartan (COZAAR) 25 MG tablet Take 1 tablet (25 mg total) by mouth daily. IM PROGRAM  . [START ON 04/23/2020] nicotine (NICODERM CQ - DOSED IN MG/24 HOURS) 14 mg/24hr patch Place 1 patch (14 mg total) onto the skin daily.  . nicotine (NICODERM CQ - DOSED IN MG/24 HOURS) 21 mg/24hr patch Place 1 patch (21 mg total) onto the skin daily.  . [DISCONTINUED] feeding supplement (RESOURCE BREEZE) LIQD Take 1 Container by mouth daily at 3 pm.  . [DISCONTINUED] hydrochlorothiazide (HYDRODIURIL) 25 MG tablet Take 1 tablet (25 mg total) by mouth daily.  . [DISCONTINUED] omeprazole (PRILOSEC) 40 MG capsule Take 1 capsule (40 mg total) by mouth 2 (two) times daily.  . [DISCONTINUED] sucralfate (CARAFATE) 1 G tablet Take 1 tablet (1 g total) by mouth 4 (four) times daily.   No facility-administered encounter medications on file as of 03/23/2020.    Follow-up: Return in 4 weeks (on 04/20/2020).   Pt discussed with Dr. Jimmye Norman.  Mitzi Hansen, MD Internal Medicine Resident PGY-2 [facility] Pager: [phone] 03/24/2020 5:56 PM

## 2020-03-23 ENCOUNTER — Other Ambulatory Visit: Payer: Self-pay

## 2020-03-23 ENCOUNTER — Ambulatory Visit: Payer: Self-pay | Admitting: Internal Medicine

## 2020-03-23 ENCOUNTER — Encounter: Payer: Self-pay | Admitting: Internal Medicine

## 2020-03-23 VITALS — BP 162/103 | HR 100 | Temp 98.3°F | Ht 63.0 in | Wt 210.6 lb

## 2020-03-23 DIAGNOSIS — I69398 Other sequelae of cerebral infarction: Secondary | ICD-10-CM

## 2020-03-23 DIAGNOSIS — R531 Weakness: Secondary | ICD-10-CM

## 2020-03-23 DIAGNOSIS — R42 Dizziness and giddiness: Secondary | ICD-10-CM

## 2020-03-23 DIAGNOSIS — Z598 Other problems related to housing and economic circumstances: Secondary | ICD-10-CM

## 2020-03-23 DIAGNOSIS — R7303 Prediabetes: Secondary | ICD-10-CM

## 2020-03-23 DIAGNOSIS — Z599 Problem related to housing and economic circumstances, unspecified: Secondary | ICD-10-CM

## 2020-03-23 DIAGNOSIS — M7989 Other specified soft tissue disorders: Secondary | ICD-10-CM

## 2020-03-23 DIAGNOSIS — D751 Secondary polycythemia: Secondary | ICD-10-CM

## 2020-03-23 DIAGNOSIS — R0602 Shortness of breath: Secondary | ICD-10-CM

## 2020-03-23 DIAGNOSIS — E785 Hyperlipidemia, unspecified: Secondary | ICD-10-CM

## 2020-03-23 DIAGNOSIS — F17210 Nicotine dependence, cigarettes, uncomplicated: Secondary | ICD-10-CM

## 2020-03-23 DIAGNOSIS — N3941 Urge incontinence: Secondary | ICD-10-CM

## 2020-03-23 DIAGNOSIS — I10 Essential (primary) hypertension: Secondary | ICD-10-CM

## 2020-03-23 DIAGNOSIS — Z8673 Personal history of transient ischemic attack (TIA), and cerebral infarction without residual deficits: Secondary | ICD-10-CM

## 2020-03-23 DIAGNOSIS — R0609 Other forms of dyspnea: Secondary | ICD-10-CM

## 2020-03-23 DIAGNOSIS — D500 Iron deficiency anemia secondary to blood loss (chronic): Secondary | ICD-10-CM

## 2020-03-23 DIAGNOSIS — R200 Anesthesia of skin: Secondary | ICD-10-CM

## 2020-03-23 LAB — POCT GLYCOSYLATED HEMOGLOBIN (HGB A1C): Hemoglobin A1C: 6 % — AB (ref 4.0–5.6)

## 2020-03-23 LAB — GLUCOSE, CAPILLARY: Glucose-Capillary: 116 mg/dL — ABNORMAL HIGH (ref 70–99)

## 2020-03-24 ENCOUNTER — Other Ambulatory Visit: Payer: Self-pay | Admitting: Internal Medicine

## 2020-03-24 DIAGNOSIS — D751 Secondary polycythemia: Secondary | ICD-10-CM | POA: Insufficient documentation

## 2020-03-24 DIAGNOSIS — R0609 Other forms of dyspnea: Secondary | ICD-10-CM | POA: Insufficient documentation

## 2020-03-24 DIAGNOSIS — Z8673 Personal history of transient ischemic attack (TIA), and cerebral infarction without residual deficits: Secondary | ICD-10-CM | POA: Insufficient documentation

## 2020-03-24 DIAGNOSIS — Z599 Problem related to housing and economic circumstances, unspecified: Secondary | ICD-10-CM | POA: Insufficient documentation

## 2020-03-24 DIAGNOSIS — R7303 Prediabetes: Secondary | ICD-10-CM | POA: Insufficient documentation

## 2020-03-24 DIAGNOSIS — E785 Hyperlipidemia, unspecified: Secondary | ICD-10-CM | POA: Insufficient documentation

## 2020-03-24 DIAGNOSIS — N3941 Urge incontinence: Secondary | ICD-10-CM | POA: Insufficient documentation

## 2020-03-24 LAB — LIPID PANEL
Chol/HDL Ratio: 6.6 ratio — ABNORMAL HIGH (ref 0.0–4.4)
Cholesterol, Total: 283 mg/dL — ABNORMAL HIGH (ref 100–199)
HDL: 43 mg/dL (ref >39)
LDL Chol Calc (NIH): 196 mg/dL — ABNORMAL HIGH (ref 0–99)
Triglycerides: 227 mg/dL — ABNORMAL HIGH (ref 0–149)
VLDL Cholesterol Cal: 44 mg/dL — ABNORMAL HIGH (ref 5–40)

## 2020-03-24 LAB — CMP14 + ANION GAP
ALT: 14 IU/L (ref 0–32)
AST: 10 IU/L (ref 0–40)
Albumin/Globulin Ratio: 1.8 (ref 1.2–2.2)
Albumin: 4.5 g/dL (ref 3.8–4.9)
Alkaline Phosphatase: 128 IU/L — ABNORMAL HIGH (ref 44–121)
Anion Gap: 15 mmol/L (ref 10.0–18.0)
BUN/Creatinine Ratio: 14 (ref 9–23)
BUN: 6 mg/dL (ref 6–24)
Bilirubin Total: <0.2 mg/dL (ref 0.0–1.2)
CO2: 26 mmol/L (ref 20–29)
Calcium: 9.7 mg/dL (ref 8.7–10.2)
Chloride: 100 mmol/L (ref 96–106)
Creatinine, Ser: 0.44 mg/dL — ABNORMAL LOW (ref 0.57–1.00)
GFR calc Af Amer: 133 mL/min/{1.73_m2} (ref >59)
GFR calc non Af Amer: 116 mL/min/{1.73_m2} (ref >59)
Globulin, Total: 2.5 g/dL (ref 1.5–4.5)
Glucose: 114 mg/dL — ABNORMAL HIGH (ref 65–99)
Potassium: 4 mmol/L (ref 3.5–5.2)
Sodium: 141 mmol/L (ref 134–144)
Total Protein: 7 g/dL (ref 6.0–8.5)

## 2020-03-24 LAB — CBC
Hematocrit: 50.9 % — ABNORMAL HIGH (ref 34.0–46.6)
Hemoglobin: 16.5 g/dL — ABNORMAL HIGH (ref 11.1–15.9)
MCH: 30.1 pg (ref 26.6–33.0)
MCHC: 32.4 g/dL (ref 31.5–35.7)
MCV: 93 fL (ref 79–97)
Platelets: 330 10*3/uL (ref 150–450)
RBC: 5.49 x10E6/uL — ABNORMAL HIGH (ref 3.77–5.28)
RDW: 11.9 % (ref 11.7–15.4)
WBC: 11.7 10*3/uL — ABNORMAL HIGH (ref 3.4–10.8)

## 2020-03-24 MED ORDER — NICOTINE 21 MG/24HR TD PT24: 21.0000 mg | MEDICATED_PATCH | TRANSDERMAL | 0 refills | Status: AC

## 2020-03-24 MED ORDER — HYDROCHLOROTHIAZIDE 12.5 MG PO CAPS: 25.0000 mg | ORAL_CAPSULE | Freq: Every day | ORAL | 1 refills | Status: DC

## 2020-03-24 MED ORDER — LOSARTAN POTASSIUM 25 MG PO TABS: 25.0000 mg | ORAL_TABLET | Freq: Every day | ORAL | 0 refills | Status: DC

## 2020-03-24 MED ORDER — NICOTINE 14 MG/24HR TD PT24: 14.0000 mg | MEDICATED_PATCH | TRANSDERMAL | 0 refills | Status: AC

## 2020-03-24 NOTE — Assessment & Plan Note (Signed)
A1C 6.0 today. Discussed lifestyle modifications in detail.  Repeat A1C in 12 months.

## 2020-03-24 NOTE — Assessment & Plan Note (Addendum)
Hgb 16.5 noted on today's labs. Possibly related to her chronic tobacco use. Alk phos is very mildly above the ULN which may suggest dehydration resulting in hemoconcentration. No further workup at this time but will need continued monitoring.

## 2020-03-24 NOTE — Assessment & Plan Note (Addendum)
Blood pressure is above goal in the office today. 162/103. Previously treated with hctz however has not been taking this for several years.  She notes she eats quite a few vegetables. Sedentary lifestyle. Discussed lifestyle adjustments. Assessment: Given the degree of elevation in blood pressure above goal, in addition to 2 events that were likely strokes, I think it is reasonable to start treating agressively. Unfortunately, she has financial constraints that will limit what she is able to afford today as she is uninsured BMP today showing normal renal function and electrolyte status Plan --start 25mg  losartan daily --start hctz 25 mg daily --f/u in 4w for hypertension recheck and repeat BMP

## 2020-03-24 NOTE — Assessment & Plan Note (Signed)
Pt reports 2 episodes that occurred in 2020. She did not seek medical evaluation at that time.  She has fairly significant residual deficits of her LUE as well as left facial numbness.  Risk factors: uncontrolled hypertension, smoking, obeisity, prediabetes  Plan: will need to be aggressive with risk factor modification. Pt was enthusiastic when we discussed these things. Will ultimately need to be placed on a statin however we are limited at today's visit due to financial constraints. She would ideally have antiplatelet therapy however I am hesitant about starting aspirin today, given her history of PUD resulting in perforation back in 2014. Plavix is not feasible today due to those financial constraints. Will need to continue to discuss risks and benefits to antiplatelet therapy in the future.

## 2020-03-24 NOTE — Assessment & Plan Note (Signed)
Obtained a CBC today to evaluate for her history iron deficiency anemia. Hgb is 16.5 today.

## 2020-03-24 NOTE — Assessment & Plan Note (Signed)
Pt notes dyspnea on exertion. This has been going on for several years. She denies cough or frequent nighttime awakenings.  Suspect she has a component of COPD given her extensive smoking history ~40 pack year history. Evaluation and treatment are limited due to financial constraints at today's visit however would consider obtaining PFTs once feasible.

## 2020-03-24 NOTE — Assessment & Plan Note (Signed)
Pt notes incontinence intermittently when going from sitting to standing. This has been present since her strokes last year. She otherwise has control of her bladder and denies dysuria or other abnormalities. Will need further discussion at a future visit.

## 2020-03-24 NOTE — Assessment & Plan Note (Signed)
Currently uninsured. She is working on getting the orange card and has appt with financial counselor next week.  Referral placed to CCM for further assistance.

## 2020-03-24 NOTE — Assessment & Plan Note (Signed)
Lipid Panel     Component Value Date/Time   CHOL 283 (H) 03/23/2020 1526   TRIG 227 (H) 03/23/2020 1526   HDL 43 03/23/2020 1526   CHOLHDL 6.6 (H) 03/23/2020 1526   LDLCALC 196 (H) 03/23/2020 1526   LABVLDL 44 (H) 03/23/2020 1526   Plan: High risk ASCVD. Will need lipid lowering therapy however financial constraints are barring prescription today per pt request. Please reassess financial status at next office visit and consider starting high intensity statin therapy at that time.

## 2020-03-25 ENCOUNTER — Telehealth: Payer: Self-pay | Admitting: *Deleted

## 2020-03-25 MED FILL — LOSARTAN POTASSIUM 25 MG TA: 25 | 30 days supply | Qty: 30 | Fill #0

## 2020-03-25 MED FILL — NICOTINE 21 MG/24HR PATCH: 21 | 28 days supply | Qty: 28 | Fill #0

## 2020-03-25 MED FILL — HYDROCHLOROTHIAZIDE 12.5 MG: 12.5 | 30 days supply | Qty: 60 | Fill #0

## 2020-03-25 NOTE — Progress Notes (Signed)
DOS 03/23/20:  Internal Medicine Clinic Attending  Case discussed with Dr. Ephriam Knuckles  At the time of the visit.  We reviewed the resident's history and exam and pertinent patient test results.  I agree with the assessment, diagnosis, and plan of care documented in the resident's note.

## 2020-03-25 NOTE — Chronic Care Management (AMB) (Signed)
  Care Management   Note  03/25/2020 Name: FAREEHA EVON MRN: [id number] DOB: Oct 28, 1965  ALICEN DONALSON is a 54 y.o. year old female who is a primary care patient of [name], Rylee, MD. I reached out to [name] by phone today in response to a referral sent by Ms. Brendia Sacks Marines's PCP, Elige Radon, MD.   Ms. Uhde was given information about care management services today including:  1. Care management services include personalized support from designated clinical staff supervised by her physician, including individualized plan of care and coordination with other care providers 2. 24/7 contact phone numbers for assistance for urgent and routine care needs. 3. The patient may stop care management services at any time by phone call to the office staff.  Patient agreed to services and verbal consent obtained.   Follow up plan: Telephone appointment with care management team member scheduled for: 03/31/2020  [facility] Guide, Embedded Care Coordination [facility] Management

## 2020-03-30 ENCOUNTER — Ambulatory Visit: Payer: Self-pay

## 2020-03-31 ENCOUNTER — Ambulatory Visit: Payer: Self-pay

## 2020-03-31 ENCOUNTER — Ambulatory Visit: Payer: Self-pay | Admitting: *Deleted

## 2020-03-31 DIAGNOSIS — Z8673 Personal history of transient ischemic attack (TIA), and cerebral infarction without residual deficits: Secondary | ICD-10-CM

## 2020-03-31 DIAGNOSIS — I10 Essential (primary) hypertension: Secondary | ICD-10-CM

## 2020-03-31 DIAGNOSIS — Z599 Problem related to housing and economic circumstances, unspecified: Secondary | ICD-10-CM

## 2020-03-31 DIAGNOSIS — R7303 Prediabetes: Secondary | ICD-10-CM

## 2020-03-31 DIAGNOSIS — D751 Secondary polycythemia: Secondary | ICD-10-CM

## 2020-03-31 NOTE — Progress Notes (Signed)
Internal Medicine Clinic Resident  I have personally reviewed this encounter including the documentation in this note and/or discussed this patient with the care management provider. I will address any urgent items identified by the care management provider and will communicate my actions to the patient's PCP. I have reviewed the patient's CCM visit with my supervising attending, Dr Vincent.  Rorey Bisson, MD  [facility] PGY-2 03/31/2020    

## 2020-03-31 NOTE — Chronic Care Management (AMB) (Signed)
Care Management   Initial Visit Note  03/31/2020 Name: [name] MRN: [id number] DOB: 1966/02/01  Subjective: See care plan  Objective: BP Readings from Last 3 Encounters:  03/23/20 (!) 162/103  02/04/13 121/83  01/28/13 127/71   Lab Results  Component Value Date   CHOL 283 (H) 03/23/2020   HDL 43 03/23/2020   LDLCALC 196 (H) 03/23/2020   TRIG 227 (H) 03/23/2020   CHOLHDL 6.6 (H) 03/23/2020   Lab Results  Component Value Date   HGBA1C 6.0 (A) 03/23/2020    Assessment: [name] is a 54 y.o. year old female who sees Tara Radon, MD for primary care. The care management team was consulted for assistance with care management and care coordination needs related to Disease Management Educational Needs Care Coordination Other community resources and assistance with applying for disability.   Review of patient status, including review of consultants reports, relevant laboratory and other test results, and collaboration with appropriate care team members and the patient's provider was performed as part of comprehensive patient evaluation and provision of care management services.    SDOH (Social Determinants of Health) assessments performed: No See Care Plan activities for detailed interventions related to [facility])     Outpatient Encounter Medications as of 03/31/2020  Medication Sig   hydrochlorothiazide (MICROZIDE) 12.5 MG capsule Take 2 capsules (25 mg total) by mouth daily. IM PROGRAM   losartan (COZAAR) 25 MG tablet Take 1 tablet (25 mg total) by mouth daily. IM PROGRAM   [START ON 04/23/2020] nicotine (NICODERM CQ - DOSED IN MG/24 HOURS) 14 mg/24hr patch Place 1 patch (14 mg total) onto the skin daily.   nicotine (NICODERM CQ - DOSED IN MG/24 HOURS) 21 mg/24hr patch Place 1 patch (21 mg total) onto the skin daily.   No facility-administered encounter medications on file as of 03/31/2020.    Goals Addressed              This Visit's Progress      Patient Stated     Patient told CCM BSW " I need help with food insecurity, applying for disabilty and comunity resources." (pt-stated)        CARE PLAN ENTRY (see longitudinal plan of care for additional care plan information)   Current Barriers:   Chronic Disease Management support, education, and care coordination needs related to HTN and HLD  Case Manager Clinical Goal(s):   Over the next 30-90 days, patient will work with BSW to address needs related to Financial constraints related to lack of income  Limited access to food  Lacks knowledge of community resources such as Company secretary (SNAP) and assistance with applying for long term disability in patient with HTN and HLD  Interventions:   Collaborated with BSW to initiate plan of care to address needs related to Financial constraints related to lack of income  Limited access to food  Lacks knowledge of community resource: such as Company secretary (SNAP) and assistance with applying for long term disability in patient with HTN and HLD  Patient Self Care Activities:   Patient verbalizes understanding of plan to work with CCM Team to address patient concerns.  Self administers medications as prescribed  Attends all scheduled provider appointments  Calls pharmacy for medication refills  Performs ADL's independently  Performs IADL's independently  Calls provider office for new concerns or questions  Initial goal documentation         Follow up plan:  The care management team  will reach out to the patient again over the next 30 days.   Ms. Yohe was given information about Care Management services today including:  1. Care Management services include personalized support from designated clinical staff supervised by a physician, including individualized plan of care and coordination with other care providers 2. 24/7 contact phone numbers for assistance for urgent  and routine care needs. 3. The patient may stop Care Management services at any time (effective at the end of the month) by phone call to the office staff.  Patient agreed to services and verbal consent obtained.  Cranford Mon RN, CCM, CDCES CCM Clinic RN Care Manager [phone]

## 2020-03-31 NOTE — Chronic Care Management (AMB) (Deleted)
Patient Care Plan: General Plan of Care (Adult)    Problem Identified: Health Promotion or Disease Self-Management (General Plan of Care)   Priority: High  Onset Date: 03/31/2020    Long-Range Goal: Self-Management Plan Developed   Start Date: 03/31/2020  Note:   Evidence-based guidance:  Review biopsychosocial determinants of health screens.  Determine level of modifiable health risk.  Assess level of patient activation, level of readiness, importance and confidence to make changes.  Evoke change talk using open-ended questions, pros and cons, as well as looking forward.  Identify areas where behavior change may lead to improved health.  Partner with patient to develop a robust self-management plan that includes lifestyle factors, such as weight loss, exercise and healthy nutrition, as well as goals specific to disease risks.  Support patient and family/caregiver active participation in decision-making and self-management plan.  Implement additional goals and interventions based on identified risk factors to reduce health risk.  Facilitate advance care planning.  Review need for preventive screening based on age, sex, family history and health history.    Notes:    Task: Mutually Develop and Foster Achievement of Patient Goals   Priority: Routine  Note:   Care Management Activities:    - barriers to meeting goals identified - choices provided - collaboration with team encouraged - questions answered - resources needed to meet goals identified - self-reliance encouraged    Notes: Patient to be referred to [facility] when consent is obtained.   Patient provided with contact information for [facility] DSS and encouraged to call about [place] SNAP application

## 2020-03-31 NOTE — Patient Instructions (Signed)
Licensed Clinical Social Worker Visit Information  Goals we discussed today:  Goals Addressed            This Visit's Progress   . Find Help in My Community       Follow Up Date 04/20/20   - follow-up on any referrals for help I am given    Why is this important?   Knowing how and where to find help for yourself or family in your neighborhood and community is an important skill.  You will want to take some steps to learn how.    Notes: Patient to sign consent for [facility]/Disability Assistance Program at clinic appointment on 04/06/20.   Patient provided with contact information for [place] and encouraged to call for assistance with [place] SNAP application .    [name] Make and Keep All Appointments       Follow Up Date 05/01/2020   - ask family or friend for a ride - call to cancel if needed - keep a calendar with appointment dates    Why is this important?   Part of staying healthy is seeing the doctor for follow-up care.  If you forget your appointments, there are some things you can do to stay on track.    Notes: Patient states that she did not participate in follow up appointments as needed after stroke and wants to improve.         Ms. Buehring was given information about Chronic Care Management services today including:  1. CCM service includes personalized support from designated clinical staff supervised by her physician, including individualized plan of care and coordination with other care providers 2. 24/7 contact phone numbers for assistance for urgent and routine care needs. 3. Service will only be billed when office clinical staff spend 20 minutes or more in a month to coordinate care. 4. Only one practitioner may furnish and bill the service in a calendar month. 5. The patient may stop CCM services at any time (effective at the end of the month) by phone call to the office staff. 6. The patient will be responsible for cost  sharing (co-pay) of up to 20% of the service fee (after annual deductible is met).  Patient agreed to services and verbal consent obtained.   Patient verbalizes understanding of instructions provided today.   Follow up plan: Appointment scheduled for SW follow up with client by phone on: 04/20/20      Ronn Melena, [place] Coordination Social Worker [place] [phone]

## 2020-03-31 NOTE — Chronic Care Management (AMB) (Signed)
Chronic Care Management    Clinical Social Work General Note  03/31/2020 Name: [name] MRN: [id number] DOB: 11-24-1965  [name] is a 54 y.o. year old female who is a primary care patient of [name], Rylee, MD. The CCM was consulted to assist the patient with Financial Difficulties related to inability to work.   [name] was given information about Chronic Care Management services today including:  1. CCM service includes personalized support from designated clinical staff supervised by her physician, including individualized plan of care and coordination with other care providers 2. 24/7 contact phone numbers for assistance for urgent and routine care needs. 3. Service will only be billed when office clinical staff spend 20 minutes or more in a month to coordinate care. 4. Only one practitioner may furnish and bill the service in a calendar month. 5. The patient may stop CCM services at any time (effective at the end of the month) by phone call to the office staff. 6. The patient will be responsible for cost sharing (co-pay) of up to 20% of the service fee (after annual deductible is met).  Patient agreed to services and verbal consent obtained.   Review of patient status, including review of consultants reports, relevant laboratory and other test results, and collaboration with appropriate care team members and the patient's provider was performed as part of comprehensive patient evaluation and provision of chronic care management services.    SDOH (Social Determinants of Health) assessments and interventions performed:  Yes SDOH Interventions     Most Recent Value  SDOH Interventions  SDOH Interventions for the Following Domains Financial Strain, Food Insecurity  Food Insecurity Interventions Assist with SNAP Application  Financial Strain Interventions Other (Comment)  [will submit referral to [place] when consent obtained from patient.]        Outpatient Encounter Medications as of 03/31/2020  Medication Sig  . hydrochlorothiazide (MICROZIDE) 12.5 MG capsule Take 2 capsules (25 mg total) by mouth daily. IM PROGRAM  . losartan (COZAAR) 25 MG tablet Take 1 tablet (25 mg total) by mouth daily. IM PROGRAM  . [START ON 04/23/2020] nicotine (NICODERM CQ - DOSED IN MG/24 HOURS) 14 mg/24hr patch Place 1 patch (14 mg total) onto the skin daily.  . nicotine (NICODERM CQ - DOSED IN MG/24 HOURS) 21 mg/24hr patch Place 1 patch (21 mg total) onto the skin daily.   No facility-administered encounter medications on file as of 03/31/2020.    Goals Addressed            This Visit's Progress   . Find Help in My Community       Follow Up Date 04/20/20   - follow-up on any referrals for help I am given    Why is this important?   Knowing how and where to find help for yourself or family in your neighborhood and community is an important skill.  You will want to take some steps to learn how.    Notes: Patient to sign consent for [facility]ability Assistance Program at clinic appointment on 04/06/20.   Patient provided with contact information for [place] and encouraged to call for assistance with [place] SNAP application .    [name] Make and Keep All Appointments       Follow Up Date 05/01/2020   - ask family or friend for a ride - call to cancel if needed - keep a calendar with appointment dates    Why is  this important?   Part of staying healthy is seeing the doctor for follow-up care.  If you forget your appointments, there are some things you can do to stay on track.    Notes: Patient states that she did not participate in follow up appointments as needed after stroke and wants to improve.        Follow Up Plan: Appointment scheduled for SW follow up with client by phone on: 04/20/20         Ronn Melena, [place] Coordination Social Worker [place] [phone]

## 2020-03-31 NOTE — Patient Instructions (Signed)
Visit Information  Goals Addressed              This Visit's Progress     Patient Stated   .  Patient told CCM BSW " I need help with food insecurity, applying for disabilty and comunity resources." (pt-stated)        CARE PLAN ENTRY (see longitudinal plan of care for additional care plan information)   Current Barriers:  . Chronic Disease Management support, education, and care coordination needs related to HTN and HLD  Case Manager Clinical Goal(s):  [name] Over the next 30-90 days, patient will work with BSW to address needs related to Financial constraints related to lack of income . Limited access to food . Lacks knowledge of community resources such as Company secretary (SNAP) and assistance with applying for long term disability in patient with HTN and HLD  Interventions:  . Collaborated with BSW to initiate plan of care to address needs related to Financial constraints related to lack of income . Limited access to food . Lacks knowledge of community resource: such as Company secretary (SNAP) and assistance with applying for long term disability in patient with HTN and HLD  Patient Self Care Activities:  . Patient verbalizes understanding of plan to work with CCM Team to address patient concerns. . Self administers medications as prescribed . Attends all scheduled provider appointments . Calls pharmacy for medication refills . Performs ADL's independently . Performs IADL's independently . Calls provider office for new concerns or questions  Initial goal documentation        [name] was given information about Care Management services today including:  1. Care Management services include personalized support from designated clinical staff supervised by her physician, including individualized plan of care and coordination with other care providers 2. 24/7 contact phone numbers for assistance for urgent and routine care  needs. 3. The patient may stop CCM services at any time (effective at the end of the month) by phone call to the office staff.  Patient agreed to services and verbal consent obtained.   The patient verbalized understanding of instructions provided today and declined a print copy of patient instruction materials.   The care management team will reach out to the patient again over the next 30 days.   Cranford Mon RN, CCM, CDCES CCM Clinic RN Care Manager [phone]

## 2020-04-01 NOTE — Progress Notes (Signed)
Internal Medicine Clinic Attending  CCM services provided by the care management provider and their documentation were discussed with Dr. Aslam. We reviewed the pertinent findings, urgent action items addressed by the resident and non-urgent items to be addressed by the PCP.  I agree with the assessment, diagnosis, and plan of care documented in the CCM and resident's note.  Chene Kasinger Thomas Harlyn Rathmann, MD 04/01/2020  

## 2020-04-01 NOTE — Progress Notes (Addendum)
Internal Medicine Clinic Resident  I have personally reviewed this encounter including the documentation in this note and/or discussed this patient with the care management provider. I will address any urgent items identified by the care management provider and will communicate my actions to the patient's PCP. I have reviewed the patient's CCM visit with my supervising attending, Dr Williams.  Donja Tipping, MD  [facility] PGY-2 04/01/2020    Internal Medicine Attending: I have reviewed the above documentation and agree with the CCM visit findings and plans. Dr. Julie Williams, MD     

## 2020-04-06 ENCOUNTER — Telehealth: Payer: Self-pay | Admitting: *Deleted

## 2020-04-06 ENCOUNTER — Ambulatory Visit: Payer: Self-pay | Admitting: *Deleted

## 2020-04-06 ENCOUNTER — Ambulatory Visit: Payer: Self-pay

## 2020-04-06 DIAGNOSIS — E785 Hyperlipidemia, unspecified: Secondary | ICD-10-CM

## 2020-04-06 DIAGNOSIS — D751 Secondary polycythemia: Secondary | ICD-10-CM

## 2020-04-06 DIAGNOSIS — I10 Essential (primary) hypertension: Secondary | ICD-10-CM

## 2020-04-06 NOTE — Chronic Care Management (AMB) (Signed)
Care Management   Follow Up Note   04/06/2020 Name: [name] MRN: [id number] DOB: 1965/07/31  Referred by: Mitzi Hansen, MD Reason for referral : Care Coordination (HTN, HLD, prediabetes, polycythemia)   [name] is a 54 y.o. year old female who is a primary care patient of [name], [name], MD. The care management team was consulted for assistance with care management and care coordination needs.    Review of patient status, including review of consultants reports, relevant laboratory and other test results, and collaboration with appropriate care team members and the patient's provider was performed as part of comprehensive patient evaluation and provision of chronic care management services.    SDOH (Social Determinants of Health) assessments performed: No See Care Plan activities for detailed interventions related to [facility])     Advanced Directives: See Care Plan and Vyn[employer] application for related entries.   Goals      Patient Stated   .  "A friend is going to loan me her wrist blood pressure monitor so I can start checking my blood pressure at home." (pt-stated)      [place] (see longitudinal plan of care for additional care plan information)  Objective:  . Last practice recorded BP readings:  BP Readings from Last 3 Encounters:  03/23/20 (!) 162/103  02/04/13 121/83  01/28/13 127/71 .   [name] Most recent eGFR/CrCl: No results found for: EGFR  No components found for: CRCL  Current Barriers:  [name] Knowledge Deficits related to basic understanding of hypertension pathophysiology and self care management . Knowledge Deficits related to understanding of medications prescribed for management of hypertension . Film/video editor.  Met with patient in the clinic to discuss role of CCM RN. Patient reports good medication taking behavior ; says she can afford the $4 copay she is charged at the [facility] for her current medications    Case Manager Clinical Goal(s):  [name] Over the next 30-60 days, patient will verbalize understanding of plan for hypertension management . Over the next 30-60 days, patient will attend all scheduled medical appointments:  . Over the next 30-60 days, patient will demonstrate improved adherence to prescribed treatment plan for hypertension as evidenced by taking all medications as prescribed, monitoring and recording blood pressure as directed, adhering to low sodium/DASH diet  Interventions:  . Evaluation of current treatment plan related to hypertension self management and patient's adherence to plan as established by provider. . Provided education to patient re: stroke prevention, s/s of heart attack and stroke, DASH diet, complications of uncontrolled blood pressure- gave patient booklet entitled Controlling Blood Pressure and ask her to review before next month's call, reviewed blood pressure targets and encouraged her to measure and record her blood pressures daily in the HTN section of the [place] Management spiral bound day calendar given to her . Reviewed medications with patient and discussed importance of compliance- will refer her to [facility] clinic pharmacy technician should she require medication copay assistance in the future . Discussed plans with patient for ongoing care management follow up and provided patient with direct contact information for care management team . Reviewed scheduled/upcoming provider appointments including: need to make follow up clinic appointment  Patient Self Care Activities:  . UNABLE to independently:control blood pressure to meet targets . Self administers medications as prescribed . Attends all scheduled provider appointments . Calls provider office for new concerns, questions, or BP outside discussed parameters . Checks BP and records as  discussed . Follows a low sodium diet/DASH diet  Initial goal documentation      .   Patient told CCM BSW " I need help with food insecurity, applying for disabilty and community resources." (pt-stated)      CARE PLAN ENTRY (see longitudinal plan of care for additional care plan information)   Current Barriers:  . Chronic Disease Management support, education, and care coordination needs related to HTN and HLD  Case Manager Clinical Goal(s):  [name] Over the next 30-90 days, patient will work with BSW to address needs related to Financial constraints related to lack of income . Limited access to food . Lacks knowledge of community resources such as Tax adviser (SNAP) and assistance with applying for long term disability in patient with HTN and HLD  Interventions:  . Collaborated with BSW to initiate plan of care to address needs related to Financial constraints related to lack of income . Limited access to food . Lacks knowledge of community resource: such as Tax adviser (SNAP) and assistance with applying for long term disability in patient with HTN and HLD  Patient Self Care Activities:  . Patient verbalizes understanding of plan to work with CCM Team to address patient concerns. . Self administers medications as prescribed . Attends all scheduled provider appointments . Calls pharmacy for medication refills . Performs ADL's independently . Performs IADL's independently . Calls provider office for new concerns or questions  Initial goal documentation       Other   .  Blood Pressure < 140/90      BP Readings from Last 3 Encounters:  03/23/20 (!) 162/103  02/04/13 121/83  01/28/13 127/71   Not meeting blood pressure targets.    .  Find Help in My Community      Follow Up Date 04/20/20   - follow-up on any referrals for help I am given    Why is this important?   Knowing how and where to find help for yourself or family in your neighborhood and community is an important skill.  You will want to take  some steps to learn how.    Notes: Patient to sign consent for [facility]/Disability Assistance Program at clinic appointment on 04/06/20.   Patient provided with contact information for [place] and encouraged to call for assistance with [place] SNAP application .    [name]  LDL CALC < 100      Lab Results  Component Value Date   CHOL 283 (H) 03/23/2020   HDL 43 03/23/2020   LDLCALC 196 (H) 03/23/2020   TRIG 227 (H) 03/23/2020   CHOLHDL 6.6 (H) 03/23/2020   Not meeting targets Provided patient with booklet entitled " Understanding and Managing High Cholesterol"  Provided handout entitled "Cholesterol and Triglyceride Management" and included the written results of her lipid panel drawn 03/23/20 and targets . Requested she review the educational material prior to next month's call.  Discussed the likely need for statin therapy in the near future to reduce risk of cardiovascular events so referral placed to clinic pharmacy technician [facility] Horsfall in case patient should need help with copay for statin medication    .  Make and Keep All Appointments      Follow Up Date 05/01/2020   - ask family or friend for a ride - call to cancel if needed - keep a calendar with appointment dates    Why is this important?   Part of  staying healthy is seeing the doctor for follow-up care.  If you forget your appointments, there are some things you can do to stay on track.    Notes: Patient states that she did not participate in follow up appointments as needed after stroke and wants to improve.         The care management team will reach out to the patient again over the next 30-60 days.   Kelli Churn RN, CCM, [place] [facility] RN Care Manager [phone]

## 2020-04-06 NOTE — Telephone Encounter (Signed)
°  Care Management   Outreach Note  04/06/2020 Name: ROSSETTA KAMA MRN: [id number] DOB: [birth year]  Referred by: Elige Radon, MD Reason for referral : Care Coordination (HTN, HLD, prediabetes, polycythemia)   An unsuccessful telephone outreach was attempted today with the purpose of completing the chornic . The patient was referred to the case management team for assistance with care management and care coordination.   Follow Up Plan: The care management team will reach out to the patient again over the next 30-60 days.   Cranford Mon RN, CCM, CDCES CCM Clinic RN Care Manager [phone]

## 2020-04-06 NOTE — Patient Instructions (Signed)
Visit Information  It was nice meeting you today. Goals Addressed              This Visit's Progress     Patient Stated   .  "A friend is going to loan me her wrist blood pressure monitor so I can start checking my blood pressure at home." (pt-stated)        [name] (see longitudinal plan of care for additional care plan information)  Objective:  . Last practice recorded BP readings:  BP Readings from Last 3 Encounters:  03/23/20 (!) 162/103  02/04/13 121/83  01/28/13 127/71 .   [name] Most recent eGFR/CrCl: No results found for: EGFR  No components found for: CRCL  Current Barriers:  [name] Knowledge Deficits related to basic understanding of hypertension pathophysiology and self care management . Knowledge Deficits related to understanding of medications prescribed for management of hypertension . Film/video editor.  Met with patient in the clinic to discuss role of CCM RN. Patient reports good medication taking behavior ; says she can afford the $4 copay she is charged at the [facility] for her current medications   Case Manager Clinical Goal(s):  [name] Over the next 30-60 days, patient will verbalize understanding of plan for hypertension management . Over the next 30-60 days, patient will attend all scheduled medical appointments:  . Over the next 30-60 days, patient will demonstrate improved adherence to prescribed treatment plan for hypertension as evidenced by taking all medications as prescribed, monitoring and recording blood pressure as directed, adhering to low sodium/DASH diet  Interventions:  . Evaluation of current treatment plan related to hypertension self management and patient's adherence to plan as established by provider. . Provided education to patient re: stroke prevention, s/s of heart attack and stroke, DASH diet, complications of uncontrolled blood pressure- gave patient booklet entitled Controlling Blood Pressure and ask her to review  before next month's call, reviewed blood pressure targets and encouraged her to measure and record her blood pressures daily in the HTN section of the [place] Management spiral bound day calendar given to her . Reviewed medications with patient and discussed importance of compliance- will refer her to [facility] clinic pharmacy technician should she require medication copay assistance in the future . Discussed plans with patient for ongoing care management follow up and provided patient with direct contact information for care management team . Reviewed scheduled/upcoming provider appointments including: need to make follow up clinic appointment  Patient Self Care Activities:  . UNABLE to independently:control blood pressure to meet targets . Self administers medications as prescribed . Attends all scheduled provider appointments . Calls provider office for new concerns, questions, or BP outside discussed parameters . Checks BP and records as discussed . Follows a low sodium diet/DASH diet  Initial goal documentation      .  Patient told CCM BSW " I need help with food insecurity, applying for disabilty and community resources." (pt-stated)        CARE PLAN ENTRY (see longitudinal plan of care for additional care plan information)   Current Barriers:  . Chronic Disease Management support, education, and care coordination needs related to HTN and HLD  Case Manager Clinical Goal(s):  [name] Over the next 30-90 days, patient will work with BSW to address needs related to Financial constraints related to lack of income . Limited access to food . Lacks knowledge of community resources such as Youth worker Program [place]) and assistance  with applying for long term disability in patient with HTN and HLD  Interventions:  . Collaborated with BSW to initiate plan of care to address needs related to Financial constraints related to lack of income . Limited  access to food . Lacks knowledge of community resource: such as Tax adviser (SNAP) and assistance with applying for long term disability in patient with HTN and HLD  Patient Self Care Activities:  . Patient verbalizes understanding of plan to work with CCM Team to address patient concerns. . Self administers medications as prescribed . Attends all scheduled provider appointments . Calls pharmacy for medication refills . Performs ADL's independently . Performs IADL's independently . Calls provider office for new concerns or questions  Initial goal documentation       Other   .  Blood Pressure < 140/90        BP Readings from Last 3 Encounters:  03/23/20 (!) 162/103  02/04/13 121/83  01/28/13 127/71   Not meeting blood pressure targets.    [name]  LDL CALC < 100        Lab Results  Component Value Date   CHOL 283 (H) 03/23/2020   HDL 43 03/23/2020   LDLCALC 196 (H) 03/23/2020   TRIG 227 (H) 03/23/2020   CHOLHDL 6.6 (H) 03/23/2020   Not meeting targets Provided patient with booklet entitled " Understanding and Managing High Cholesterol"  Provided handout entitled "Cholesterol and Triglyceride Management" and included the written results of her lipid panel drawn 03/23/20 and targets . Requested she review the educational material prior to next month's call.  Discussed the likely need for statin therapy in the near future to reduce risk of cardiovascular events so referral placed to clinic pharmacy technician Cheryle Horsfall in case patient should need help with copay for statin medication       The patient verbalized understanding of instructions provided today and declined a print copy of patient instruction materials.   The care management team will reach out to the patient again over the next 30-60 days.   Kelli Churn RN, CCM, [place] [facility] RN Care Manager [phone]

## 2020-04-07 ENCOUNTER — Ambulatory Visit: Payer: Self-pay

## 2020-04-07 DIAGNOSIS — R7303 Prediabetes: Secondary | ICD-10-CM

## 2020-04-07 DIAGNOSIS — Z8673 Personal history of transient ischemic attack (TIA), and cerebral infarction without residual deficits: Secondary | ICD-10-CM

## 2020-04-07 DIAGNOSIS — I10 Essential (primary) hypertension: Secondary | ICD-10-CM

## 2020-04-07 DIAGNOSIS — Z599 Problem related to housing and economic circumstances, unspecified: Secondary | ICD-10-CM

## 2020-04-07 NOTE — Patient Instructions (Signed)
Visit Information  Goals Addressed              This Visit's Progress   .  Patient told CCM BSW " I need help with food insecurity, applying for disabilty and community resources." (pt-stated)        CARE PLAN ENTRY (see longitudinal plan of care for additional care plan information)   Current Barriers:  . Chronic Disease Management support, education, and care coordination needs related to HTN and HLD  Case Manager Clinical Goal(s):  [name] Over the next 30-90 days, patient will work with BSW to address needs related to Financial constraints related to lack of income . Limited access to food . Lacks knowledge of community resources such as Company secretary (SNAP) and assistance with applying for long term disability in patient with HTN and HLD  Interventions:  . Received signed consent for referral to Disability Assistance Program . Completed referral form and submitted along with consent . Contacted patient to inform her that referral was submitted and she will likely be contacted before the end of the week to schedule appointment. . Informed patient that program representative should also be able to assist with SNAP application.  . Encouraged her to inquire about this when contacted.   Patient Self Care Activities:  . Patient verbalizes understanding of plan to work with CCM Team to address patient concerns. . Self administers medications as prescribed . Attends all scheduled provider appointments . Calls pharmacy for medication refills . Performs ADL's independently . Performs IADL's independently . Calls provider office for new concerns or questions  Please see past updates related to this goal by clicking on the "Past Updates" button in the selected goal         Patient verbalizes understanding of instructions provided today.   Telephone follow up appointment with care management team member scheduled for:04/20/20 @ 9:00AM    Donavon Kimrey,  [place] Embedded Care Coordination Social Worker [facility] [phone]                           \

## 2020-04-07 NOTE — Chronic Care Management (AMB) (Signed)
  Care Management   Follow Up Note   04/07/2020 Name: [name] MRN: [id number] DOB: March 29, 1966  Referred by: Elige Radon, MD Reason for referral : Care Coordination (disability application assistance)   [name] is a 54 y.o. year old female who is a primary care patient of [name], Rica Records, MD. The care management team was consulted for assistance with care management and care coordination needs.    Review of patient status, including review of consultants reports, relevant laboratory and other test results, and collaboration with appropriate care team members and the patient's provider was performed as part of comprehensive patient evaluation and provision of chronic care management services.    SDOH (Social Determinants of Health) assessments performed: No See Care Plan activities for detailed interventions related to [facility])     Advanced Directives: See Care Plan and [employer] application for related entries.   Goals Addressed              This Visit's Progress   .  Patient told CCM BSW " I need help with food insecurity, applying for disabilty and community resources." (pt-stated)        CARE PLAN ENTRY (see longitudinal plan of care for additional care plan information)   Current Barriers:  . Chronic Disease Management support, education, and care coordination needs related to HTN and HLD  Case Manager Clinical Goal(s):  [name] Over the next 30-90 days, patient will work with BSW to address needs related to Financial constraints related to lack of income . Limited access to food . Lacks knowledge of community resources such as Company secretary (SNAP) and assistance with applying for long term disability in patient with HTN and HLD  Interventions:  . Received signed consent for referral to Disability Assistance Program . Completed referral form and submitted along with consent . Contacted patient to inform her that referral was submitted  and she will likely be contacted before the end of the week to schedule appointment. . Informed patient that program representative should also be able to assist with SNAP application.  . Encouraged her to inquire about this when contacted.   Patient Self Care Activities:  . Patient verbalizes understanding of plan to work with CCM Team to address patient concerns. . Self administers medications as prescribed . Attends all scheduled provider appointments . Calls pharmacy for medication refills . Performs ADL's independently . Performs IADL's independently . Calls provider office for new concerns or questions  Please see past updates related to this goal by clicking on the "Past Updates" button in the selected goal          Telephone follow up appointment with care management team member scheduled for:04/20/20 @ 9:00 AM     [name], BSW Embedded Care Coordination Social Worker [facility] [phone]

## 2020-04-07 NOTE — Progress Notes (Signed)
Internal Medicine Clinic Resident  I have personally reviewed this encounter including the documentation in this note and/or discussed this patient with the care management provider. I will address any urgent items identified by the care management provider and will communicate my actions to the patient's PCP. I have reviewed the patient's CCM visit with my supervising attending, Dr Raines.  Krystl Wickware, MD 04/07/2020    

## 2020-04-07 NOTE — Progress Notes (Signed)
Internal Medicine Clinic Resident  I have personally reviewed this encounter including the documentation in this note and/or discussed this patient with the care management provider. I will address any urgent items identified by the care management provider and will communicate my actions to the patient's PCP. I have reviewed the patient's CCM visit with my supervising attending, Dr Raines.  Mattias Walmsley D Hettie Roselli, DO 04/07/2020    

## 2020-04-08 NOTE — Progress Notes (Signed)
Internal Medicine Clinic Attending  CCM services provided by the care management provider and their documentation were reviewed with Dr. Liang.  We reviewed the pertinent findings, urgent action items addressed by the resident and non-urgent items to be addressed by the PCP.  I agree with the assessment, diagnosis, and plan of care documented in the CCM and resident's note.  Marlena Barbato N Alexx Mcburney, MD 04/08/2020  

## 2020-04-08 NOTE — Progress Notes (Signed)
Internal Medicine Clinic Attending  CCM services provided by the care management provider and their documentation were reviewed with Dr. Bloomfield.  We reviewed the pertinent findings, urgent action items addressed by the resident and non-urgent items to be addressed by the PCP.  I agree with the assessment, diagnosis, and plan of care documented in the CCM and resident's note.  Leea Rambeau N Alliyah Roesler, MD 04/08/2020  

## 2020-04-20 ENCOUNTER — Ambulatory Visit: Payer: Self-pay

## 2020-04-20 DIAGNOSIS — E785 Hyperlipidemia, unspecified: Secondary | ICD-10-CM

## 2020-04-20 DIAGNOSIS — Z8673 Personal history of transient ischemic attack (TIA), and cerebral infarction without residual deficits: Secondary | ICD-10-CM

## 2020-04-20 DIAGNOSIS — I10 Essential (primary) hypertension: Secondary | ICD-10-CM

## 2020-04-20 NOTE — Progress Notes (Signed)
Internal Medicine Clinic Resident  I have personally reviewed this encounter including the documentation in this note and/or discussed this patient with the care management provider. I will address any urgent items identified by the care management provider and will communicate my actions to the patient's PCP. I have reviewed the patient's CCM visit with my supervising attending, Dr Mullen.  Maveryk Renstrom, MD  [facility] PGY-2 04/20/2020    

## 2020-04-20 NOTE — Patient Instructions (Signed)
Visit Information  Goals Addressed              This Visit's Progress   .  Patient told CCM BSW " I need help with food insecurity, applying for disabilty and community resources." (pt-stated)        CARE PLAN ENTRY (see longitudinal plan of care for additional care plan information)   Current Barriers:  . Chronic Disease Management support, education, and care coordination needs related to HTN and HLD  Case Manager Clinical Goal(s):  [name] Over the next 30-90 days, patient will work with BSW to address needs related to Financial constraints related to lack of income . Limited access to food . Lacks knowledge of community resources such as Company secretary (SNAP) and assistance with applying for long term disability in patient with HTN and HLD  Interventions:  . Contacted patient for follow up on referral to Disability Assistance Program; states she has appointment scheduled for 04/21/20. [name] Reminded patient to inquire about assistance with [place] SNAP application   Patient Self Care Activities:  . Patient verbalizes understanding of plan to work with CCM Team to address patient concerns. . Self administers medications as prescribed . Attends all scheduled provider appointments . Calls pharmacy for medication refills . Performs ADL's independently . Performs IADL's independently . Calls provider office for new concerns or questions  Please see past updates related to this goal by clicking on the "Past Updates" button in the selected goal         Patient verbalizes understanding of instructions provided today.   The care management team will reach out to the patient again over the next 30 days.     [name], BSW Embedded Care Coordination Social Worker [facility] [phone]

## 2020-04-20 NOTE — Chronic Care Management (AMB) (Signed)
  Care Management   Follow Up Note   04/20/2020 Name: [name] MRN: [id number] DOB: [birth year]  Referred by: Elige Radon, MD Reason for referral : Care Coordination (disability and [place] SNAP applications)   [name] is a 54 y.o. year old female who is a primary care patient of [name], Rica Records, MD. The care management team was consulted for assistance with care management and care coordination needs.    Review of patient status, including review of consultants reports, relevant laboratory and other test results, and collaboration with appropriate care team members and the patient's provider was performed as part of comprehensive patient evaluation and provision of chronic care management services.    SDOH (Social Determinants of Health) assessments performed: No See Care Plan activities for detailed interventions related to [facility])     Advanced Directives: See Care Plan and Vynca application for related entries.   Goals Addressed              This Visit's Progress   .  Patient told CCM BSW " I need help with food insecurity, applying for disabilty and community resources." (pt-stated)        CARE PLAN ENTRY (see longitudinal plan of care for additional care plan information)   Current Barriers:  . Chronic Disease Management support, education, and care coordination needs related to HTN and HLD  Case Manager Clinical Goal(s):  [name] Over the next 30-90 days, patient will work with BSW to address needs related to Financial constraints related to lack of income . Limited access to food . Lacks knowledge of community resources such as Company secretary (SNAP) and assistance with applying for long term disability in patient with HTN and HLD  Interventions:  . Contacted patient for follow up on referral to Disability Assistance Program; states she has appointment scheduled for 04/21/20. [name] Reminded patient to inquire about assistance with [place] SNAP  application   Patient Self Care Activities:  . Patient verbalizes understanding of plan to work with CCM Team to address patient concerns. . Self administers medications as prescribed . Attends all scheduled provider appointments . Calls pharmacy for medication refills . Performs ADL's independently . Performs IADL's independently . Calls provider office for new concerns or questions  Please see past updates related to this goal by clicking on the "Past Updates" button in the selected goal          The care management team will reach out to the patient again over the next 30 days.     [name], BSW Embedded Care Coordination Social Worker [facility] [phone]

## 2020-05-05 ENCOUNTER — Ambulatory Visit: Payer: Self-pay | Admitting: *Deleted

## 2020-05-05 DIAGNOSIS — Z8673 Personal history of transient ischemic attack (TIA), and cerebral infarction without residual deficits: Secondary | ICD-10-CM

## 2020-05-05 DIAGNOSIS — I10 Essential (primary) hypertension: Secondary | ICD-10-CM

## 2020-05-05 DIAGNOSIS — D751 Secondary polycythemia: Secondary | ICD-10-CM

## 2020-05-05 DIAGNOSIS — R7303 Prediabetes: Secondary | ICD-10-CM

## 2020-05-05 DIAGNOSIS — E785 Hyperlipidemia, unspecified: Secondary | ICD-10-CM

## 2020-05-05 NOTE — Patient Instructions (Signed)
Visit Information It was nice speaking with you today. Please review the educational material I miled you prior to next month's call.  Goals Addressed              This Visit's Progress     Patient Stated   .  "A friend is going to loan me her wrist blood pressure monitor so I can start checking my blood pressure at home." (pt-stated)        [place] (see longitudinal plan of care for additional care plan information)  Objective:  . Last practice recorded BP readings:  BP Readings from Last 3 Encounters:  03/23/20 (!) 162/103  02/04/13 121/83  01/28/13 127/71 .   [name] Most recent eGFR/CrCl: No results found for: EGFR  No components found for: CRCL  Current Barriers:  [name] Knowledge Deficits related to basic understanding of hypertension pathophysiology and self care management . Knowledge Deficits related to understanding of medications prescribed for management of hypertension . 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 She states she has reviewed some of the educational material given to her re: managing HTN and HLD. She reports good medication taking behavior and says she is not having difficulty  paying for her medications as they have a $4 copay and she uses the [facility].  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 She also reports some mild swelling in her left leg that comes and goes. She says she is using the nicotine patch but is still smoking a little less than a pack a day. She says she would like written information on smoking cessation. Patient says she does not drink etoh. She describes emotional lability since her stroke last year saying she will laugh and/or cry inappropriately. She says he will make a follow up clinic appointment as soon as she get s letter from her landlord about her rent  to take to the financial counselor at the clinic   Case Manager Clinical Goal(s):  [name] Over the next 30-60 days, patient will verbalize understanding of plan for hypertension management . Over the next 30-60 days, patient will attend all scheduled medical appointments:  . Over the next 30-60 days, patient will demonstrate improved adherence to prescribed treatment plan for hypertension as evidenced by taking all medications as prescribed, monitoring and recording blood pressure as directed, adhering to low sodium/DASH diet  Interventions:  . Evaluation of current treatment plan related to hypertension self management and patient's adherence to plan as established by provider. . Reviewed medications with patient and discussed importance of compliance . Discussed HTN self management strategies including: taking medications as prescribed, smoking cessation, limiting sodium to a teaspoon per day by using lite salt or herbs, rinsing canned vegetables in water prior to heating, avoiding or limiting consumption of  packaged foods/microwave meals and frozen foods with sauces, managing stress and exercise . Encouraged her to measure and record her blood pressures any time she has the opportunity to check it at a blood pressure check station in stores, pharmacies, etc until she can borrow the wrist monitor from her friend. Reviewed blood pressure targets and referred her to written information provided to her during her clinic visit on 10/11 on managing HTN . 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  . Encouraged her to keep left leg elevated when sitting to assist with managing edema . Mailed patient Emmi article on "Quitting Smoking" and "Drugs to Help You Stop Using Tobacco"  . Discussed plans with patient for ongoing care management  follow up and provided patient with direct contact information for care management team . Reviewed scheduled/upcoming provider appointments including: need to make follow up clinic appointment  Patient Self Care Activities:  . UNABLE to independently:control blood pressure to meet targets . Self administers medications as prescribed . Attends all scheduled provider appointments . Calls provider office for new concerns, questions, or BP outside discussed parameters . Checks BP and records as discussed . Follows a low sodium diet/DASH diet  Please see past updates related to this goal by clicking on the "Past Updates" button in the selected goal       .  Patient told CCM BSW " I need help with food insecurity, applying for disabilty and community resources." (pt-stated)        CARE PLAN ENTRY (see longitudinal plan of care for additional care plan information)   Current Barriers:  . Chronic Disease Management support, education, and care coordination needs related to HTN and HLD- patient says she completed her appointment with the disability assistance program on 04/21/20 and was told it usually takes 2-4 months for a disability determination decision  Case Manager Clinical Goal(s):  [name] Over the next 30-90 days, patient will work with BSW to address needs related to Financial constraints related to lack of income . Limited access to food . Lacks knowledge of community resources such as Tax adviser (SNAP) and assistance with applying for long term disability in patient with HTN and HLD  Interventions:  . Contacted patient for follow up on referral to [place]; states she has appointment scheduled for 04/21/20. [name] Reminded patient to inquire about assistance with [place] SNAP application  . Assessed status of applying for long term disability  Patient Self Care Activities:  . Patient verbalizes understanding of plan to work with CCM Team to  address patient concerns. . Self administers medications as prescribed . Attends all scheduled provider appointments . Calls pharmacy for medication refills . Performs ADL's independently . Performs IADL's independently . Calls provider office for new concerns or questions  Please see past updates related to this goal by clicking on the "Past Updates" button in the selected goal         The patient verbalized understanding of instructions provided today and declined a print copy of patient instruction materials.   The care management team will reach out to the patient again over the next 30-60 days.   Kelli Churn RN, CCM, [facility] RN Care Manager [phone]

## 2020-05-05 NOTE — Chronic Care Management (AMB) (Signed)
°Care Management  ° °Follow Up Note ° ° °05/05/2020 °Name: Odie F Cherne MRN: [id number] DOB: 01/28/1966 ° °Referred by: [name], Rylee, MD °Reason for referral : Care Coordination (HTN, HLD, prediabetes, polycythemia) ° ° °Catherin F Derosa is a 54 y.o. year old female who is a primary care patient of [name], Rylee, MD. The care management team was consulted for assistance with care management and care coordination needs.   ° °Review of patient status, including review of consultants reports, relevant laboratory and other test results, and collaboration with appropriate care team members and the patient's provider was performed as part of comprehensive patient evaluation and provision of chronic care management services.   ° °SDOH (Social Determinants of Health) assessments performed: No °See Care Plan activities for detailed interventions related to SDOH)  °  ° °Advanced Directives: See Care Plan and Vyn[employer] application for related entries.  ° °Goals Addressed   °  °  °  °  °  ° This Visit's Progress  °  ° Patient Stated  ° •  "A friend is going to loan me her wrist blood pressure monitor so I can start checking my blood pressure at home." (pt-stated)     °   CARE PLAN ENTRY °(see longitudinal plan of care for additional care plan information) ° °Objective:  °• Last practice recorded BP readings:  °BP Readings from Last 3 Encounters:  °03/23/20 (!) 162/103  °02/04/13 121/83  °01/28/13 127/71 •  ° °• Most recent eGFR/CrCl: No results found for: EGFR  No components found for: CRCL ° °Current Barriers:  °• Knowledge Deficits related to basic understanding of hypertension pathophysiology and self care management °• Knowledge Deficits related to understanding of medications prescribed for management of hypertension °• Financial Constraints.  °Spoke with patient via phone to complete follow up assessment. She states she has reviewed some of the educational material given to her re: managing HTN and HLD. She reports  good medication taking behavior and says she is not having difficulty  paying for her medications as they have a $4 copay and she uses the  Outpatient Pharmacy.  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 She also reports some mild swelling in her left leg that comes and goes. She says she is using the nicotine patch but is still smoking a little less than a pack a day. She says she would like written information on smoking cessation. Patient says she does not drink etoh. She describes emotional lability since her stroke last year saying she will laugh and/or cry inappropriately. She says he will make a follow up clinic appointment as soon as she get s letter from her landlord about her rent to take to the financial counselor at the clinic  ° °Case Manager Clinical Goal(s):  °• Over the next 30-60 days, patient will verbalize understanding of plan for hypertension management °• Over the next 30-60 days, patient will attend all scheduled medical appointments:  °• Over the next 30-60 days, patient will demonstrate improved adherence to prescribed treatment plan for hypertension as evidenced by taking all medications as prescribed, monitoring and recording blood pressure as directed, adhering to low sodium/DASH diet ° °Interventions:  °• Evaluation of current treatment plan related to hypertension self management and patient's adherence to plan as established by provider. °• Reviewed medications with patient and discussed importance   importance of compliance  Discussed HTN self management strategies including: taking medications as prescribed, smoking cessation, limiting sodium to a teaspoon per day by using lite salt or herbs, rinsing canned vegetables in water prior to heating, avoiding or limiting consumption of  packaged foods/microwave meals and frozen foods with sauces, managing stress and  exercise  Encouraged her to measure and record her blood pressures any time she has the opportunity to check it at a blood pressure check station in stores, pharmacies, etc until she can borrow the wrist monitor from her friend. 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   Encouraged her to keep left leg elevated when sitting to assist with managing edema  Mailed patient Emmi article on "Quitting Smoking" and "Drugs to Help You Stop Using Tobacco"   Discussed plans with patient for ongoing care management follow up and provided patient with direct contact information for care management team  Reviewed scheduled/upcoming provider appointments including: need to make follow up clinic appointment  Patient Self Care Activities:   UNABLE to independently:control blood pressure to meet targets  Self administers medications as prescribed  Attends all scheduled provider appointments  Calls provider office for new concerns, questions, or BP outside discussed parameters  Checks BP and records as discussed  Follows a low sodium diet/DASH diet  Please see past updates related to this goal by clicking on the "Past Updates" button in the selected goal         Patient told CCM BSW " I need help with food insecurity, applying for disabilty and community resources." (pt-stated)        CARE PLAN ENTRY (see longitudinal plan of care for additional care plan information)   Current Barriers:   Chronic Disease Management support, education, and care coordination needs related to HTN and HLD- patient says she completed her appointment with the disability assistance program on 04/21/20 and was  told it usually takes 2-4 months for a disability determination decision  Case Manager Clinical Goal(s):   Over the next 30-90 days, patient will work with BSW to address needs related to Financial constraints related to lack of income  Limited access to food  Lacks knowledge of community resources such as Tax adviser (SNAP) and assistance with applying for long term disability in patient with HTN and HLD  Interventions:   Contacted patient for follow up on referral to [place]; states she has appointment scheduled for 04/21/20.  Reminded patient to inquire about assistance with [place] SNAP application   Assessed status of applying for long term disability  Patient Self Care Activities:   Patient verbalizes understanding of plan to work with CCM Team to address patient concerns.  Self administers medications as prescribed  Attends all scheduled provider appointments  Calls pharmacy for medication refills  Performs ADL's independently  Performs IADL's independently  Calls provider office for new concerns or questions  Please see past updates related to this goal by clicking on the "Past Updates" button in the selected goal          The care management team will reach out to the patient again over the next 30-60 days.   Kelli Churn RN, CCM,  CCM Clinic RN Care Manager °[phone] °

## 2020-05-05 NOTE — Progress Notes (Signed)
Internal Medicine Clinic Attending  CCM services provided by the care management provider and their documentation were reviewed with Dr. Chen.  We reviewed the pertinent findings, urgent action items addressed by the resident and non-urgent items to be addressed by the PCP.  I agree with the assessment, diagnosis, and plan of care documented in the CCM and resident's note.  Theresa Dohrman N Ahmar Pickrell, MD 05/05/2020  

## 2020-05-05 NOTE — Progress Notes (Signed)
Internal Medicine Clinic Resident  I have personally reviewed this encounter including the documentation in this note and/or discussed this patient with the care management provider. I will address any urgent items identified by the care management provider and will communicate my actions to the patient's PCP. I have reviewed the patient's CCM visit with my supervising attending, Dr Raines.  Mekala Winger Y Dalayla [place], MD 05/05/2020   

## 2020-05-07 MED FILL — LOSARTAN POTASSIUM 25 MG TA: 25 | 30 days supply | Qty: 30 | Fill #1

## 2020-05-07 MED FILL — HYDROCHLOROTHIAZIDE 12.5 MG: 12.5 | 30 days supply | Qty: 60 | Fill #1

## 2020-05-20 ENCOUNTER — Ambulatory Visit: Payer: Self-pay

## 2020-05-20 DIAGNOSIS — Z8673 Personal history of transient ischemic attack (TIA), and cerebral infarction without residual deficits: Secondary | ICD-10-CM

## 2020-05-20 DIAGNOSIS — I10 Essential (primary) hypertension: Secondary | ICD-10-CM

## 2020-05-20 DIAGNOSIS — E785 Hyperlipidemia, unspecified: Secondary | ICD-10-CM

## 2020-05-20 NOTE — Patient Instructions (Signed)
Visit Information  Goals Addressed              This Visit's Progress   .  Patient told CCM BSW " I need help with food insecurity, applying for disabilty and community resources." (pt-stated)        CARE PLAN ENTRY (see longitudinal plan of care for additional care plan information)   Current Barriers:  . Chronic Disease Management support, education, and care coordination needs related to HTN and HLD- patient says she completed her appointment with the disability assistance program on 04/21/20 and was told it usually takes 2-4 months for a disability determination decision  Case Manager Clinical Goal(s):  [name] Over the next 30-90 days, patient will work with BSW to address needs related to Financial constraints related to lack of income . Limited access to food . Lacks knowledge of community resources such as Company secretary (SNAP) and assistance with applying for long term disability in patient with HTN and HLD  Interventions:  [name] Message sent to Layla Barter with Disability Assistance Program requesting update on status of application   Patient Self Care Activities:  . Patient verbalizes understanding of plan to work with CCM Team to address patient concerns. . Self administers medications as prescribed . Attends all scheduled provider appointments . Calls pharmacy for medication refills . Performs ADL's independently . Performs IADL's independently . Calls provider office for new concerns or questions  Please see past updates related to this goal by clicking on the "Past Updates" button in the selected goal        Awaiting response from [facility]   [place], [place] Embedded Care Coordination Social Worker [facility] [phone]

## 2020-05-20 NOTE — Chronic Care Management (AMB) (Signed)
  Care Management   Follow Up Note   05/20/2020 Name: [name] MRN: [id number] DOB: 03-Mar-1966  [name] is enrolled in a Managed Medicaid plan: No. Outreach attempt today was successful.    Referred by: Elige Radon, MD Reason for referral : Care Coordination (disability and [place] SNAP applications)   [name] is a 54 y.o. year old female who is a primary care patient of [name], Rica Records, MD. The care management team was consulted for assistance with care management and care coordination needs.    Review of patient status, including review of consultants reports, relevant laboratory and other test results, and collaboration with appropriate care team members and the patient's provider was performed as part of comprehensive patient evaluation and provision of chronic care management services.    Goals Addressed              This Visit's Progress   .  Patient told CCM BSW " I need help with food insecurity, applying for disabilty and community resources." (pt-stated)        CARE PLAN ENTRY (see longitudinal plan of care for additional care plan information)   Current Barriers:  . Chronic Disease Management support, education, and care coordination needs related to HTN and HLD- patient says she completed her appointment with the disability assistance program on 04/21/20 and was told it usually takes 2-4 months for a disability determination decision  Case Manager Clinical Goal(s):  [name] Over the next 30-90 days, patient will work with BSW to address needs related to Financial constraints related to lack of income . Limited access to food . Lacks knowledge of community resources such as Company secretary (SNAP) and assistance with applying for long term disability in patient with HTN and HLD  Interventions:  [name] Message sent to Layla Barter with Disability Assistance Program requesting update on status of application   Patient Self  Care Activities:  . Patient verbalizes understanding of plan to work with CCM Team to address patient concerns. . Self administers medications as prescribed . Attends all scheduled provider appointments . Calls pharmacy for medication refills . Performs ADL's independently . Performs IADL's independently . Calls provider office for new concerns or questions  Please see past updates related to this goal by clicking on the "Past Updates" button in the selected goal          Awaiting response from [facility]     [place], [place] Embedded Care Coordination Social Worker [facility] Center [phone]

## 2020-05-25 NOTE — Progress Notes (Signed)
Internal Medicine Clinic Resident  I have personally reviewed this encounter including the documentation in this note and/or discussed this patient with the care management provider. I will address any urgent items identified by the care management provider and will communicate my actions to the patient's PCP. I have reviewed the patient's CCM visit with my supervising attending, Dr Narendra.  Kenneth Lax N Albirda Shiel, DO 05/25/2020   

## 2020-05-26 ENCOUNTER — Ambulatory Visit: Payer: Self-pay

## 2020-05-26 DIAGNOSIS — Z8673 Personal history of transient ischemic attack (TIA), and cerebral infarction without residual deficits: Secondary | ICD-10-CM

## 2020-05-26 DIAGNOSIS — I10 Essential (primary) hypertension: Secondary | ICD-10-CM

## 2020-05-26 DIAGNOSIS — E785 Hyperlipidemia, unspecified: Secondary | ICD-10-CM

## 2020-05-26 NOTE — Chronic Care Management (AMB) (Signed)
  Care Management   Follow Up Note   05/26/2020 Name: LELLA MULLANY MRN: [id number] DOB: Mar 10, 1966  [name] is enrolled in a Managed Medicaid plan: No. Outreach attempt today was unsuccessful.    Referred by: Elige Radon, MD Reason for referral : Care Coordination (disability and [place] SNAP applications)   SINDA LEEDOM is a 54 y.o. year old female who is a primary care patient of [name], Rica Records, MD. The care management team was consulted for assistance with care management and care coordination needs.    Review of patient status, including review of consultants reports, relevant laboratory and other test results, and collaboration with appropriate care team members and the patient's provider was performed as part of comprehensive patient evaluation and provision of chronic care management services.    Goals Addressed              This Visit's Progress   .  Patient told CCM BSW " I need help with food insecurity, applying for disabilty and community resources." (pt-stated)        CARE PLAN ENTRY (see longitudinal plan of care for additional care plan information)   Current Barriers:  . Chronic Disease Management support, education, and care coordination needs related to HTN and HLD- patient says she completed her appointment with the disability assistance program on 04/21/20 and was told it usually takes 2-4 months for a disability determination decision  Case Manager Clinical Goal(s):  [name] Over the next 30-90 days, patient will work with BSW to address needs related to Financial constraints related to lack of income . Limited access to food . Lacks knowledge of community resources such as Company secretary (SNAP) and assistance with applying for long term disability in patient with HTN and HLD  Interventions:  . Received the following communication from Thurnell Lose with Disability Assistance Program. "Our office filed her initial SSI/SSDI  claim with [employer] on 11.8.2021. As far as her FNS (SNAP/food stamp) application, she was approved to receive benefits as of 11.5.2021. She and her husband will receive $459.00 monthly. Has the client received her FNS card? If not, please let me know so I can follow up with DSS"   Patient Self Care Activities:  . Patient verbalizes understanding of plan to work with CCM Team to address patient concerns. . Self administers medications as prescribed . Attends all scheduled provider appointments . Calls pharmacy for medication refills . Performs ADL's independently . Performs IADL's independently . Calls provider office for new concerns or questions  Please see past updates related to this goal by clicking on the "Past Updates" button in the selected goal          A HIPPA compliant phone message was left for the patient providing contact information and requesting a return call.       [name], BSW Embedded Care Coordination Social Worker [facility] [phone]

## 2020-05-26 NOTE — Patient Instructions (Signed)
Visit Information  Goals Addressed              This Visit's Progress   .  Patient told CCM BSW " I need help with food insecurity, applying for disabilty and community resources." (pt-stated)        CARE PLAN ENTRY (see longitudinal plan of care for additional care plan information)   Current Barriers:  . Chronic Disease Management support, education, and care coordination needs related to HTN and HLD- patient says she completed her appointment with the disability assistance program on 04/21/20 and was told it usually takes 2-4 months for a disability determination decision  Case Manager Clinical Goal(s):  [name] Over the next 30-90 days, patient will work with BSW to address needs related to Financial constraints related to lack of income . Limited access to food . Lacks knowledge of community resources such as Company secretary (SNAP) and assistance with applying for long term disability in patient with HTN and HLD  Interventions:  . Received the following communication from Thurnell Lose with Disability Assistance Program. "Our office filed her initial SSI/SSDI claim with SSA on 11.8.2021. As far as her FNS (SNAP/food stamp) application, she was approved to receive benefits as of 11.5.2021. She and her husband will receive $459.00 monthly. Has the client received her FNS card? If not, please let me know so I can follow up with DSS"   Patient Self Care Activities:  . Patient verbalizes understanding of plan to work with CCM Team to address patient concerns. . Self administers medications as prescribed . Attends all scheduled provider appointments . Calls pharmacy for medication refills . Performs ADL's independently . Performs IADL's independently . Calls provider office for new concerns or questions  Please see past updates related to this goal by clicking on the "Past Updates" button in the selected goal           A HIPAA compliant phone message was  left for the patient providing contact information and requesting a return call.      [name], BSW Embedded Care Coordination Social Worker [facility] [phone]

## 2020-05-26 NOTE — Progress Notes (Signed)
Internal Medicine Clinic Attending  CCM services provided by the care management provider and their documentation were discussed with Dr. Rehman. We reviewed the pertinent findings, urgent action items addressed by the resident and non-urgent items to be addressed by the PCP.  I agree with the assessment, diagnosis, and plan of care documented in the CCM and resident's note.  Keriana Sarsfield, MD 05/26/2020  

## 2020-05-26 NOTE — Progress Notes (Signed)
Internal Medicine Clinic Resident  I have personally reviewed this encounter including the documentation in this note and/or discussed this patient with the care management provider. I will address any urgent items identified by the care management provider and will communicate my actions to the patient's PCP. I have reviewed the patient's CCM visit with my supervising attending, Dr Narendra.  Tanique Matney M Dorell Gatlin, MD 05/26/2020    

## 2020-05-27 NOTE — Progress Notes (Signed)
Internal Medicine Clinic Attending  CCM services provided by the care management provider and their documentation were discussed with Dr. Krienke. We reviewed the pertinent findings, urgent action items addressed by the resident and non-urgent items to be addressed by the PCP.  I agree with the assessment, diagnosis, and plan of care documented in the CCM and resident's note.  Willistine Ferrall, MD 05/27/2020  

## 2020-05-29 ENCOUNTER — Ambulatory Visit: Payer: Self-pay

## 2020-05-29 ENCOUNTER — Telehealth: Payer: Self-pay

## 2020-05-29 DIAGNOSIS — Z8673 Personal history of transient ischemic attack (TIA), and cerebral infarction without residual deficits: Secondary | ICD-10-CM

## 2020-05-29 DIAGNOSIS — E785 Hyperlipidemia, unspecified: Secondary | ICD-10-CM

## 2020-05-29 DIAGNOSIS — I10 Essential (primary) hypertension: Secondary | ICD-10-CM

## 2020-05-29 NOTE — Telephone Encounter (Signed)
   Care Management   Outreach Note  05/29/2020 Name: [name] MRN: [id number] DOB: 04-29-1966  Referred by: Elige Radon, MD Reason for referral : Care Coordination (disability/[place] SNAP applications)   A second unsuccessful telephone outreach was attempted today. The patient was referred to the case management team for assistance with care management and care coordination.   Follow Up Plan: A HIPAA compliant phone message was left for the patient providing contact information and requesting a return call.     [name], BSW Embedded Care Coordination Social Worker [facility] [phone]

## 2020-05-29 NOTE — Progress Notes (Signed)
Internal Medicine Clinic Resident  I have personally reviewed this encounter including the documentation in this note and/or discussed this patient with the care management provider. I will address any urgent items identified by the care management provider and will communicate my actions to the patient's PCP. I have reviewed the patient's CCM visit with my supervising attending, Dr Narendra.  Tara Salatino, DO 05/29/2020   

## 2020-05-29 NOTE — Chronic Care Management (AMB) (Signed)
  Care Management   Follow Up Note   05/29/2020 Name: [name] MRN: [id number] DOB: 13-Jun-1966  [name] is enrolled in a Managed Medicaid plan: No. Outreach attempt today was successful.    Referred by: Elige Radon, MD Reason for referral : Care Coordination (disability, [place] SNAP applications)   [name] is a 54 y.o. year old female who is a primary care patient of [name], Rica Records, MD. The care management team was consulted for assistance with care management and care coordination needs.    Review of patient status, including review of consultants reports, relevant laboratory and other test results, and collaboration with appropriate care team members and the patient's provider was performed as part of comprehensive patient evaluation and provision of chronic care management services.    Goals Addressed              This Visit's Progress   .  Patient told CCM BSW " I need help with food insecurity, applying for disabilty and community resources." (pt-stated)        CARE PLAN ENTRY (see longitudinal plan of care for additional care plan information)   Current Barriers:  . Chronic Disease Management support, education, and care coordination needs related to HTN and HLD- patient says she completed her appointment with the disability assistance program on 04/21/20 and was told it usually takes 2-4 months for a disability determination decision  Case Manager Clinical Goal(s):  [name] Over the next 30-90 days, patient will work with BSW to address needs related to Financial constraints related to lack of income . Limited access to food . Lacks knowledge of community resources such as Company secretary (SNAP) and assistance with applying for long term disability in patient with HTN and HLD  Interventions:  . Talked to patient today and she confirmed approval of [place] SNAP application and receipt of FNS card.   Disability application pending.     Patient Self Care Activities:  . Patient verbalizes understanding of plan to work with CCM Team to address patient concerns. . Self administers medications as prescribed . Attends all scheduled provider appointments . Calls pharmacy for medication refills . Performs ADL's independently . Performs IADL's independently . Calls provider office for new concerns or questions  Please see past updates related to this goal by clicking on the "Past Updates" button in the selected goal          The care management team will reach out to the patient again over the next 30 days.     [name], BSW Embedded Care Coordination Social Worker [facility] [phone]

## 2020-05-29 NOTE — Patient Instructions (Signed)
Visit Information  Goals Addressed              This Visit's Progress   .  Patient told CCM BSW " I need help with food insecurity, applying for disabilty and community resources." (pt-stated)        CARE PLAN ENTRY (see longitudinal plan of care for additional care plan information)   Current Barriers:  . Chronic Disease Management support, education, and care coordination needs related to HTN and HLD- patient says she completed her appointment with the disability assistance program on 04/21/20 and was told it usually takes 2-4 months for a disability determination decision  Case Manager Clinical Goal(s):  [name] Over the next 30-90 days, patient will work with BSW to address needs related to Financial constraints related to lack of income . Limited access to food . Lacks knowledge of community resources such as Company secretary (SNAP) and assistance with applying for long term disability in patient with HTN and HLD  Interventions:  . Talked to patient today and she confirmed approval of [place] SNAP application and receipt of FNS card.   Disability application pending.   Patient Self Care Activities:  . Patient verbalizes understanding of plan to work with CCM Team to address patient concerns. . Self administers medications as prescribed . Attends all scheduled provider appointments . Calls pharmacy for medication refills . Performs ADL's independently . Performs IADL's independently . Calls provider office for new concerns or questions  Please see past updates related to this goal by clicking on the "Past Updates" button in the selected goal         The patient verbalized understanding of instructions, educational materials, and care plan provided today and declined offer to receive copy of patient instructions, educational materials, and care plan.   The care management team will reach out to the patient again over the next 30 days.     [name],  BSW Embedded Care Coordination Social Worker [facility] [phone]

## 2020-06-01 NOTE — Progress Notes (Signed)
Internal Medicine Clinic Attending  CCM services provided by the care management provider and their documentation were discussed with Dr. Nguyen. We reviewed the pertinent findings, urgent action items addressed by the resident and non-urgent items to be addressed by the PCP.  I agree with the assessment, diagnosis, and plan of care documented in the CCM and resident's note.  Pandora Mccrackin, MD 06/01/2020  

## 2020-06-02 ENCOUNTER — Telehealth: Payer: Self-pay

## 2020-06-04 ENCOUNTER — Telehealth: Payer: Self-pay

## 2020-06-09 ENCOUNTER — Ambulatory Visit: Payer: Self-pay | Admitting: *Deleted

## 2020-06-09 DIAGNOSIS — I10 Essential (primary) hypertension: Secondary | ICD-10-CM

## 2020-06-09 DIAGNOSIS — Z8673 Personal history of transient ischemic attack (TIA), and cerebral infarction without residual deficits: Secondary | ICD-10-CM

## 2020-06-09 DIAGNOSIS — D751 Secondary polycythemia: Secondary | ICD-10-CM

## 2020-06-09 DIAGNOSIS — E785 Hyperlipidemia, unspecified: Secondary | ICD-10-CM

## 2020-06-09 DIAGNOSIS — R7303 Prediabetes: Secondary | ICD-10-CM

## 2020-06-09 NOTE — Patient Instructions (Signed)
Visit Information It was nice speaking with you today. Goals Addressed              This Visit's Progress     Patient Stated   .  "I have a wrist blood pressure monitor now so I am checking my blood pressure at home." (pt-stated)        CARE PLAN ENTRY (see longitudinal plan of care for additional care plan information)  Objective:  . Last practice recorded BP readings:  BP Readings from Last 3 Encounters:  03/23/20 (!) 162/103  02/04/13 121/83  01/28/13 127/71 .   [name] Most recent eGFR/CrCl: No results found for: EGFR  No components found for: CRCL  Current Barriers:  [name] Knowledge Deficits related to basic understanding of hypertension pathophysiology and self care management . Knowledge Deficits related to understanding of medications prescribed for management of hypertension . 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 She states she now has a wrist monitor and has been self monitoring her blood pressure. She says all readings have been labeled "high normal" with systolic readings in the 833-825 range and diastolic in the 05-39 range. She reports good medication taking behavior but says it is time to pick up her blood pressure medications at the [facility] but she has transportation issues. She also says transportation is the reason she has not made a follow up clinic appointment for BP recheck.   Case Manager Clinical Goal(s):  [name] Over the next 30-60 days, patient will verbalize understanding of plan for hypertension management . Over the next 30-60 days, patient will attend all scheduled medical appointments:  . Over the next 30-60 days, patient will demonstrate improved adherence to prescribed treatment plan for hypertension as evidenced by taking all medications as prescribed, monitoring and recording blood pressure as directed, adhering to low sodium/DASH diet  Interventions:  . Evaluation of current treatment plan related to  hypertension self management and patient's adherence to plan as established by provider. . Reviewed medications with patient and discussed importance of compliance . Reviewed HTN self management strategies including: taking medications as prescribed, smoking cessation, limiting sodium to a teaspoon per day by using lite salt or herbs, rinsing canned vegetables in water prior to heating, avoiding or limiting consumption of  packaged foods/microwave meals and frozen foods with sauces, managing stress and exercise . Positive reinforcement given to her for self monitoring measure her blood pressures and taking her medications as directed . Reviewed home blood pressure readings and reviewed treatment targets for both diastolic and systolic readings . Suggested she call the [facility] to see if she can have her medications mailed to her . Encouraged her to make a follow up clinic appointment and suggested it could be via telehealth as long a she has self monitored blood pressure readings to share with her provider . Discussed plans with patient for ongoing care management follow up and provided patient with direct contact information for care management team . Reviewed scheduled/upcoming provider appointments including: reinforced need to make follow up clinic appointment . Inter-disciplinary care team collaboration (see longitudinal plan of care)- Wallace about patient's transportation needs   Patient Self Care Activities:  . UNABLE to independently:control blood pressure to meet targets . Self administers medications as prescribed . Attends all scheduled provider appointments . Calls provider office for new concerns, questions, or BP outside discussed parameters . Checks BP and records as discussed . Follows  a low sodium diet/DASH diet  Please see past updates related to this goal by clicking on the "Past Updates" button in the selected goal          The  patient verbalized understanding of instructions, educational materials, and care plan provided today and declined offer to receive copy of patient instructions, educational materials, and care plan.   The care management team will reach out to the patient again over the next 30-60 days.   Kelli Churn RN, CCM, [facility] RN Care Manager [phone]

## 2020-06-09 NOTE — Progress Notes (Signed)
Internal Medicine Clinic Resident  I have personally reviewed this encounter including the documentation in this note and/or discussed this patient with the care management provider. I will address any urgent items identified by the care management provider and will communicate my actions to the patient's PCP. I have reviewed the patient's CCM visit with my supervising attending, Dr Raines.  Xiao Graul, MD Internal Medicine Resident PGY-2 [place] [facility] Pager: #[phone] 06/09/2020 1:10 PM     

## 2020-06-09 NOTE — Chronic Care Management (AMB) (Signed)
Care Management   Follow Up Note   06/09/2020 Name: [name] MRN: [id number] DOB: 1966/01/18  [name] is enrolled in a Managed Medicaid plan: No. Outreach attempt today was successful.    Referred by: Mitzi Hansen, MD Reason for referral : Care Coordination (HTN, HLD, prediabetes, polycythemia)   [name] is a 54 y.o. year old female who is a primary care patient of [name], [name], MD. The care management team was consulted for assistance with care management and care coordination needs.    Review of patient status, including review of consultants reports, relevant laboratory and other test results, and collaboration with appropriate care team members and the patient's provider was performed as part of comprehensive patient evaluation and provision of chronic care management services.    Goals Addressed              This Visit's Progress     Patient Stated   .  "I have a wrist blood pressure monitor now so I am checking my blood pressure at home." (pt-stated)        CARE PLAN ENTRY (see longitudinal plan of care for additional care plan information)  Objective:  . Last practice recorded BP readings:  BP Readings from Last 3 Encounters:  03/23/20 (!) 162/103  02/04/13 121/83  01/28/13 127/71 .   [name] Most recent eGFR/CrCl: No results found for: EGFR  No components found for: CRCL  Current Barriers:  [name] Knowledge Deficits related to basic understanding of hypertension pathophysiology and self care management . Knowledge Deficits related to understanding of medications prescribed for management of hypertension . 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 She states she now has a wrist monitor and has been self monitoring her blood pressure. She says all readings have been labeled "high normal" with systolic readings in the 892-119 range and diastolic in the 41-74 range. She reports good medication taking  behavior but says it is time to pick up her blood pressure medications at the [facility] but she has transportation issues. She also says transportation is the reason she has not made a follow up clinic appointment for BP recheck.   Case Manager Clinical Goal(s):  [name] Over the next 30-60 days, patient will verbalize understanding of plan for hypertension management . Over the next 30-60 days, patient will attend all scheduled medical appointments:  . Over the next 30-60 days, patient will demonstrate improved adherence to prescribed treatment plan for hypertension as evidenced by taking all medications as prescribed, monitoring and recording blood pressure as directed, adhering to low sodium/DASH diet  Interventions:  . Evaluation of current treatment plan related to hypertension self management and patient's adherence to plan as established by provider. . Reviewed medications with patient and discussed importance of compliance . Reviewed HTN self management strategies including: taking medications as prescribed, smoking cessation, limiting sodium to a teaspoon per day by using lite salt or herbs, rinsing canned vegetables in water prior to heating, avoiding or limiting consumption of  packaged foods/microwave meals and frozen foods with sauces, managing stress and exercise . Positive reinforcement given to her for self monitoring measure her blood pressures and taking her medications as directed . Reviewed home blood pressure readings and reviewed treatment targets for both diastolic and systolic readings . Suggested she call the [facility] to see if she can have her medications mailed to her . Encouraged her to make a follow up clinic  appointment and suggested it could be via telehealth as long a she has self monitored blood pressure readings to share with her provider . Discussed plans with patient for ongoing care management follow up and provided patient with direct contact  information for care management team . Reviewed scheduled/upcoming provider appointments including: reinforced need to make follow up clinic appointment . Inter-disciplinary care team collaboration (see longitudinal plan of care)- messaged CCM BSW Amber Chrismon about patient's transportation needs   Patient Self Care Activities:  . UNABLE to independently:control blood pressure to meet targets . Self administers medications as prescribed . Attends all scheduled provider appointments . Calls provider office for new concerns, questions, or BP outside discussed parameters . Checks BP and records as discussed . Follows a low sodium diet/DASH diet  Please see past updates related to this goal by clicking on the "Past Updates" button in the selected goal           The care management team will reach out to the patient again over the next 30-60 days.   Janet Hauser RN, CCM, [facility] CCM Clinic RN Care Manager [phone] 

## 2020-06-10 ENCOUNTER — Ambulatory Visit: Payer: Self-pay

## 2020-06-10 DIAGNOSIS — I10 Essential (primary) hypertension: Secondary | ICD-10-CM

## 2020-06-10 DIAGNOSIS — Z8673 Personal history of transient ischemic attack (TIA), and cerebral infarction without residual deficits: Secondary | ICD-10-CM

## 2020-06-10 DIAGNOSIS — E785 Hyperlipidemia, unspecified: Secondary | ICD-10-CM

## 2020-06-10 NOTE — Progress Notes (Signed)
Internal Medicine Clinic Attending  CCM services provided by the care management provider and their documentation were reviewed with Dr. [name].  We reviewed the pertinent findings, urgent action items addressed by the resident and non-urgent items to be addressed by the PCP.  I agree with the assessment, diagnosis, and plan of care documented in the CCM and resident's note.  Merlon Alcorta N Dondi Aime, MD 06/10/2020  

## 2020-06-11 NOTE — Patient Instructions (Signed)
Visit Information  Goals Addressed              This Visit's Progress   .  Patient told CCM BSW " I need help with food insecurity, applying for disabilty and community resources." (pt-stated)        CARE PLAN ENTRY (see longitudinal plan of care for additional care plan information)   Current Barriers:  . Chronic Disease Management support, education, and care coordination needs related to HTN and HLD- patient says she completed her appointment with the disability assistance program on 04/21/20 and was told it usually takes 2-4 months for a disability determination decision  Case Manager Clinical Goal(s):  [name] Over the next 30-90 days, patient will work with BSW to address needs related to Financial constraints related to lack of income . Limited access to food . Lacks knowledge of community resources such as Company secretary (SNAP) and assistance with applying for long term disability in patient with HTN and HLD  Interventions:  . Sent message to Director of [employer] to confirm service area as patient in need of transportation resource . Received response that [place] is in service area  Patient Self Care Activities:  . Patient verbalizes understanding of plan to work with CCM Team to address patient concerns. . Self administers medications as prescribed . Attends all scheduled provider appointments . Calls pharmacy for medication refills . Performs ADL's independently . Performs IADL's independently . Calls provider office for new concerns or questions  Please see past updates related to this goal by clicking on the "Past Updates" button in the selected goal        A HIPPA compliant phone message was left for the patient providing contact information and requesting a return call. Will attempt to reach again next week if no return call.       [name], BSW Embedded Care Coordination Social Worker [facility] [phone]

## 2020-06-11 NOTE — Chronic Care Management (AMB) (Signed)
  Care Management   Follow Up Note   06/11/2020 Name: [name] MRN: [id number] DOB: August 02, 1965  [name] is enrolled in a Managed Medicaid plan: No. Outreach attempt today was unsuccessful.    Referred by: Elige Radon, MD Reason for referral : No chief complaint on file.   [name] is a 54 y.o. year old female who is a primary care patient of [name], Rica Records, MD. The care management team was consulted for assistance with care management and care coordination needs.    Review of patient status, including review of consultants reports, relevant laboratory and other test results, and collaboration with appropriate care team members and the patient's provider was performed as part of comprehensive patient evaluation and provision of chronic care management services.    Goals Addressed              This Visit's Progress   .  Patient told CCM BSW " I need help with food insecurity, applying for disabilty and community resources." (pt-stated)        CARE PLAN ENTRY (see longitudinal plan of care for additional care plan information)   Current Barriers:  . Chronic Disease Management support, education, and care coordination needs related to HTN and HLD- patient says she completed her appointment with the disability assistance program on 04/21/20 and was told it usually takes 2-4 months for a disability determination decision  Case Manager Clinical Goal(s):  [name] Over the next 30-90 days, patient will work with BSW to address needs related to Financial constraints related to lack of income . Limited access to food . Lacks knowledge of community resources such as Company secretary (SNAP) and assistance with applying for long term disability in patient with HTN and HLD  Interventions:  . Sent message to Director of [employer] to confirm service area as patient in need of transportation resource . Received  response that [place] is in service area  Patient Self Care Activities:  . Patient verbalizes understanding of plan to work with CCM Team to address patient concerns. . Self administers medications as prescribed . Attends all scheduled provider appointments . Calls pharmacy for medication refills . Performs ADL's independently . Performs IADL's independently . Calls provider office for new concerns or questions  Please see past updates related to this goal by clicking on the "Past Updates" button in the selected goal          A HIPPA compliant phone message was left for the patient providing contact information and requesting a return call. Will attempt to reach again next week if no return call.       [name], BSW Embedded Care Coordination Social Worker [facility] [phone]

## 2020-06-12 NOTE — Progress Notes (Signed)
Internal Medicine Clinic Resident  I have personally reviewed this encounter including the documentation in this note and/or discussed this patient with the care management provider. I will address any urgent items identified by the care management provider and will communicate my actions to the patient's PCP. I have reviewed the patient's CCM visit with my supervising attending, Dr Raines.  Khameron Gruenwald, MD 06/12/2020   

## 2020-06-15 ENCOUNTER — Telehealth: Payer: Self-pay

## 2020-06-15 NOTE — Telephone Encounter (Signed)
  Chronic Care Management   Outreach Note  06/15/2020 Name: [name] MRN: [id number] DOB: August 22, 1965  Referred by: Elige Radon, MD Reason for referral : Care Coordination   [name] is enrolled in a Managed Medicaid Health Plan: No  A second unsuccessful telephone outreach was attempted today. The patient was referred to the case management team for assistance with care management and care coordination.   Follow Up Plan: A HIPAA compliant phone message was left for the patient providing contact information and requesting a return call.      [name], BSW Embedded Care Coordination Social Worker [facility] [phone]

## 2020-06-15 NOTE — Progress Notes (Signed)
Internal Medicine Clinic Attending  CCM services provided by the care management provider and their documentation were reviewed with Dr. Watson.  We reviewed the pertinent findings, urgent action items addressed by the resident and non-urgent items to be addressed by the PCP.  I agree with the assessment, diagnosis, and plan of care documented in the CCM and resident's note.  Muskan Bolla N Symphanie Cederberg, MD 06/15/2020  

## 2020-06-23 ENCOUNTER — Ambulatory Visit: Payer: Self-pay

## 2020-06-23 DIAGNOSIS — E785 Hyperlipidemia, unspecified: Secondary | ICD-10-CM

## 2020-06-23 DIAGNOSIS — I10 Essential (primary) hypertension: Secondary | ICD-10-CM

## 2020-06-23 DIAGNOSIS — Z8673 Personal history of transient ischemic attack (TIA), and cerebral infarction without residual deficits: Secondary | ICD-10-CM

## 2020-06-23 NOTE — Progress Notes (Signed)
Internal Medicine Clinic Resident  I have personally reviewed this encounter including the documentation in this note and/or discussed this patient with the care management provider. I will address any urgent items identified by the care management provider and will communicate my actions to the patient's PCP. I have reviewed the patient's CCM visit with my supervising attending, Dr Mullen.  Amaka Gluth K Cris Talavera, MD 06/23/2020    

## 2020-06-23 NOTE — Chronic Care Management (AMB) (Signed)
  Care Management   Follow Up Note   06/23/2020 Name: [name] MRN: [id number] DOB: August 14, 1965  [name] is enrolled in a Managed Medicaid plan: No. Outreach attempt today was successful.    Referred by: Elige Radon, MD Reason for referral : Care Coordination   [name] is a 54 y.o. year old female who is a primary care patient of [name], Rica Records, MD. The care management team was consulted for assistance with care management and care coordination needs.    Review of patient status, including review of consultants reports, relevant laboratory and other test results, and collaboration with appropriate care team members and the patient's provider was performed as part of comprehensive patient evaluation and provision of chronic care management services.    Goals Addressed              This Visit's Progress   .  Patient told CCM BSW " I need help with food insecurity, applying for disabilty and community resources." (pt-stated)        CARE PLAN ENTRY (see longitudinal plan of care for additional care plan information)   Current Barriers:  . Chronic Disease Management support, education, and care coordination needs related to HTN and HLD- patient says she completed her appointment with the disability assistance program on 04/21/20 and was told it usually takes 2-4 months for a disability determination decision  Case Manager Clinical Goal(s):  [name] Over the next 30-90 days, patient will work with BSW to address needs related to Financial constraints related to lack of income . Limited access to food . Lacks knowledge of community resources such as Company secretary (SNAP) and assistance with applying for long term disability in patient with HTN and HLD  Interventions:  . Educated patient about [facility] and gained consent to submit referral . Completed eligibility form and submitted to [employer], Scot Jun . Requested that Ms.Zimmerman follow up with patient directly when eligibility determined . Informed patient that CCM BSW will no longer be at [facility] as of 06/24/20; unsure of plan for future SW support for clinic patients.   Patient Self Care Activities:  . Patient verbalizes understanding of plan to work with CCM Team to address patient concerns. . Self administers medications as prescribed . Attends all scheduled provider appointments . Calls pharmacy for medication refills . Performs ADL's independently . Performs IADL's independently . Calls provider office for new concerns or questions  Please see past updates related to this goal by clicking on the "Past Updates" button in the selected goal          No further SW follow up at this time.  RNCM scheduled for follow up on 07/10/20  [name], BSW Embedded Care Coordination Social Worker [facility] [phone]

## 2020-06-23 NOTE — Patient Instructions (Signed)
  Goals Addressed              This Visit's Progress   .  Patient told CCM BSW " I need help with food insecurity, applying for disabilty and community resources." (pt-stated)        CARE PLAN ENTRY (see longitudinal plan of care for additional care plan information)   Current Barriers:  . Chronic Disease Management support, education, and care coordination needs related to HTN and HLD- patient says she completed her appointment with the disability assistance program on 04/21/20 and was told it usually takes 2-4 months for a disability determination decision  Case Manager Clinical Goal(s):  [name] Over the next 30-90 days, patient will work with BSW to address needs related to Financial constraints related to lack of income . Limited access to food . Lacks knowledge of community resources such as Company secretary (SNAP) and assistance with applying for long term disability in patient with HTN and HLD  Interventions:  . Educated patient about [facility] and gained consent to submit referral . Completed eligibility form and submitted to [employer], Scot Jun . Requested that Ms.Zimmerman follow up with patient directly when eligibility determined . Informed patient that CCM BSW will no longer be at [facility] as of 06/24/20; unsure of plan for future SW support for clinic patients.   Patient Self Care Activities:  . Patient verbalizes understanding of plan to work with CCM Team to address patient concerns. . Self administers medications as prescribed . Attends all scheduled provider appointments . Calls pharmacy for medication refills . Performs ADL's independently . Performs IADL's independently . Calls provider office for new concerns or questions  Please see past updates related to this goal by clicking on the "Past Updates" button in the selected goal          No further SW follow up at this time.  RNCM  scheduled for follow up on 07/10/20  Joice Lofts Chameka Mcmullen, BSW Embedded Care Coordination Social Worker [facility]

## 2020-06-25 ENCOUNTER — Telehealth: Payer: Self-pay

## 2020-07-10 ENCOUNTER — Ambulatory Visit: Payer: Self-pay | Admitting: *Deleted

## 2020-07-10 DIAGNOSIS — Z8673 Personal history of transient ischemic attack (TIA), and cerebral infarction without residual deficits: Secondary | ICD-10-CM

## 2020-07-10 DIAGNOSIS — I10 Essential (primary) hypertension: Secondary | ICD-10-CM

## 2020-07-10 DIAGNOSIS — E785 Hyperlipidemia, unspecified: Secondary | ICD-10-CM

## 2020-07-10 NOTE — Progress Notes (Signed)
This CCM encounter was reviewed and documentation approved.

## 2020-07-10 NOTE — Patient Instructions (Signed)
Visit Information It was nice speaking with you today.  Patient Care Plan: CCM RN- Hypertension (Adult)    Problem Identified: Hypertension (Hypertension)   Priority: High    Goal: Hypertension Monitored   Start Date: 04/06/2020  This Visit's Progress: On track  Priority: High  Note:   CARE PLAN ENTRY (see longitudinal plan of care for additional care plan information)  Objective:  . Last practice recorded BP readings:  BP Readings from Last 3 Encounters:  03/23/20 (!) 162/103  02/04/13 121/83  01/28/13 127/71 .   [name] Most recent eGFR/CrCl: No results found for: EGFR  No components found for: CRCL  Current Barriers:  [name] Knowledge Deficits related to basic understanding of hypertension pathophysiology and self care management . Knowledge Deficits related to understanding of medications prescribed for management of hypertension . 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 She states she now has a wrist monitor and has been self monitoring her blood pressure. She says all readings have been labeled "high normal" with systolic readings in the 606-004 range and diastolic in the 59-97 range. She says she has been out or her medications for "about a month", says she forgot to call the [facility] to see if they would mail her medications to her as was suggested by this CCM RN on last month's  call, she also says she needs refills on her medications and is asking how to get them, reports she has not made a follow up clinic appointment for BP recheck even though she was approved for transportation assistance via the [place] (TAMS)   Case Manager Clinical Goal(s):  [name] Over the next 30-60 days, patient will verbalize understanding of plan for hypertension management . Over the next 30-60 days, patient will attend all scheduled medical appointments:  . Over the next 30-60 days, patient will demonstrate  improved adherence to prescribed treatment plan for hypertension as evidenced by taking all medications as prescribed, monitoring and recording blood pressure as directed, adhering to low sodium/DASH diet  Interventions:  . Evaluation of current treatment plan related to hypertension self management and patient's adherence to plan as established by provider. . Reviewed medications with patient and discussed importance of compliance . Discussed risks to her health of not getting refills on medicines in a timely manner  . Instructed patient  to call [facility] pharmacy today for refills and to ask if medications can be mailed to her home  . Reviewed HTN self management strategies including: taking medications as prescribed, smoking cessation, limiting sodium to a teaspoon per day by using lite salt or herbs, rinsing canned vegetables in water prior to heating, avoiding or limiting consumption of  packaged foods/microwave meals and frozen foods with sauces, managing stress and exercise . Positive reinforcement given to her for self monitoring measure her blood pressures . Reviewed home blood pressure readings and reviewed treatment targets for both diastolic and systolic readings . Again encouraged her to make a follow up clinic appointment and suggested it could be via telehealth as long a she has self monitored blood pressure readings to share with her provider . Discussed plans with patient for ongoing care management follow up and provided patient with direct contact information for care management team . Reviewed scheduled/upcoming provider appointments including: reinforced need to make follow up clinic appointment  Patient Self Care Activities:  . UNABLE to independently:control blood pressure to meet targets . Self administers medications as  prescribed . Attends all scheduled provider appointments . Calls provider office for new concerns, questions, or BP outside discussed parameters . Checks BP  and records as discussed . Follows a low sodium diet/DASH diet   Patient Care Plan: CCM RN- Wellness (Adult)    Problem Identified: Barriers to Treatment     Goal: Barriers to Treatment Identified and Managed- Patient has no health insurance or income , social security disabilty pending, has not applied for Medicaid   Start Date: 07/10/2020  Expected End Date: 12/24/2020  This Visit's Progress: On track  Priority: High  Note:   Current Barriers:  . 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 Unable to independently apply for Medicaid  Nurse Case Manager Clinical Goal(s):  [name] Over the next 30-90 days, patient will work with community care guide to address needs related to applying for Medicaid  Interventions:  . 1:1 collaboration with Mitzi Hansen, MD regarding development and update of comprehensive plan of care as evidenced by provider attestation and co-signature . Inter-disciplinary care team collaboration (see longitudinal plan of care) . Care Guide referral for assistance with applying for Medicaid  Patient Goals/Self-Care Activities Over the next 30-60 days, patient will:  - Patient will work with community care guide to address care coordination needs related to applying for Medicaid   Follow Up Plan: The care management team will reach out to the patient again over the next 30-60 days.          The patient verbalized understanding of instructions, educational materials, and care plan provided today and declined offer to receive copy of patient instructions, educational materials, and care plan.   The care management team will reach out to the patient again over the next 30-60 days.   Kelli Churn RN, CCM, [facility] RN Care Manager [phone]

## 2020-07-10 NOTE — Chronic Care Management (AMB) (Signed)
Chronic Care Management   CCM RN Visit Note  07/10/2020 Name: [name] MRN: [id number] DOB: 02-23-1966  Subjective: [name] is a 55 y.o. year old female who is a primary care patient of [name], [name], MD. The care management team was consulted for assistance with disease management and care coordination needs.    Engaged with patient by telephone for follow up visit in response to provider referral for case management and/or care coordination services.   Consent to Services:  The patient was given information about Chronic Care Management services, agreed to services, and gave verbal consent prior to initiation of services.  Please see initial visit note for detailed documentation.   Patient agreed to services and verbal consent obtained.   Assessment: Review of patient past medical history, allergies, medications, health status, including review of consultants reports, laboratory and other test data, was performed as part of comprehensive evaluation and provision of chronic care management services.   SDOH (Social Determinants of Health) assessments and interventions performed:    CCM Care Plan  No Known Allergies  Outpatient Encounter Medications as of 07/10/2020  Medication Sig  . hydrochlorothiazide (MICROZIDE) 12.5 MG capsule Take 2 capsules (25 mg total) by mouth daily. IM PROGRAM  . losartan (COZAAR) 25 MG tablet Take 1 tablet (25 mg total) by mouth daily. IM PROGRAM   No facility-administered encounter medications on file as of 07/10/2020.    Patient Active Problem List   Diagnosis Date Noted  . History of stroke 03/24/2020  . Prediabetes 03/24/2020  . Polycythemia 03/24/2020  . Financial difficulties 03/24/2020  . Dyspnea on exertion 03/24/2020  . Urge incontinence 03/24/2020  . Hyperlipidemia with target LDL less than 70 03/24/2020  . Protein-calorie malnutrition, severe ([place]) 01/25/2013  . 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 Last practice recorded BP readings:  BP Readings from Last 3 Encounters:  03/23/20 (!) 162/103  02/04/13 121/83  01/28/13 127/71 .   [name] Most recent eGFR/CrCl: No results found for: EGFR  No components found for: CRCL  Current Barriers:  [name] Knowledge Deficits related to basic understanding of hypertension pathophysiology and self care management . Knowledge Deficits related to understanding of medications prescribed for management of hypertension . 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 She states she now has a wrist monitor and has been self monitoring her blood pressure. She says all readings have been labeled "high normal" with systolic readings in the 341-962 range and diastolic in the 22-97 range. She says she has been out or her medications for "about a month", says she forgot to call the [facility] pharmacy to see if they would mail her medications to her as was suggested by this CCM RN on last month's  call, she also says she needs refills on her medications and is asking how to get them, reports she has not made a follow up clinic appointment for BP recheck even though she was approved for transportation assistance via  the [employer] and [employer])   Case Manager Clinical Goal(s):  [name] Over the next 30-60 days, patient will verbalize understanding of plan for hypertension management . Over the next 30-60 days, patient will  attend all scheduled medical appointments:  . Over the next 30-60 days, patient will demonstrate improved adherence to prescribed treatment plan for hypertension as evidenced by taking all medications as prescribed, monitoring and recording blood pressure as directed, adhering to low sodium/DASH diet  Interventions:  . Evaluation of current treatment plan related to hypertension self management and patient's adherence to plan as established by provider. . Reviewed medications with patient and discussed importance of compliance . Discussed risks to her health of not getting refills on medicines in a timely manner  . Instructed patient  to call [facility] pharmacy today for refills and to ask if medications can be mailed to her home  . Reviewed HTN self management strategies including: taking medications as prescribed, smoking cessation, limiting sodium to a teaspoon per day by using lite salt or herbs, rinsing canned vegetables in water prior to heating, avoiding or limiting consumption of  packaged foods/microwave meals and frozen foods with sauces, managing stress and exercise . Positive reinforcement given to her for self monitoring measure her blood pressures . Reviewed home blood pressure readings and reviewed treatment targets for both diastolic and systolic readings . Again encouraged her to make a follow up clinic appointment and suggested it could be via telehealth as long a she has self monitored blood pressure readings to share with her provider . Discussed plans with patient for ongoing care management follow up and provided patient with direct contact information for care management team . Reviewed scheduled/upcoming provider appointments including: reinforced need to make follow up clinic appointment  Patient Self Care Activities:  . UNABLE to independently:control blood pressure to meet targets . Self administers medications as prescribed . Attends all scheduled provider  appointments . Calls provider office for new concerns, questions, or BP outside discussed parameters . Checks BP and records as discussed . Follows a low sodium diet/DASH diet   Care Plan : CCM RN- Wellness (Adult)  Updates made by Barrington Ellison, RN since 07/10/2020 12:00 AM    Problem: Barriers to Treatment     Goal: Barriers to Treatment Identified and Managed- Patient has no health insurance or income , social security disabilty pending, has not applied for Medicaid   Start Date: 07/10/2020  Expected End Date: 12/24/2020  This Visit's Progress: On track  Priority: High  Note:   Current Barriers:  . 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 Unable to independently apply for Medicaid  Nurse Case Manager Clinical Goal(s):  [name] Over the next 30-90 days, patient will work with community care guide to address needs related to applying for Medicaid  Interventions:  . 1:1 collaboration with Mitzi Hansen, MD regarding development and update of comprehensive plan of care as evidenced by provider attestation and co-signature . Inter-disciplinary care team collaboration (see longitudinal plan of care) . Care Guide referral for assistance with applying for Medicaid  Patient Goals/Self-Care Activities Over the next 30-60 days, patient will:  - Patient will work with community care guide to address care coordination needs related to applying for Medicaid   Follow Up Plan: The care management team will reach out to the patient again over the next 30-60 days.  Kelli Churn RN, CCM, [facility] RN Care Manager [phone]

## 2020-07-10 NOTE — Progress Notes (Signed)
Internal Medicine Clinic Resident  I have personally reviewed this encounter including the documentation in this note and/or discussed this patient with the care management provider. I will address any urgent items identified by the care management provider and will communicate my actions to the patient's PCP. I have reviewed the patient's CCM visit with my supervising attending, Dr Williams.  Wyland Rastetter, MD  [facility] PGY-2 07/10/2020    

## 2020-07-17 ENCOUNTER — Telehealth: Payer: Self-pay

## 2020-07-17 ENCOUNTER — Other Ambulatory Visit: Payer: Self-pay

## 2020-07-17 ENCOUNTER — Ambulatory Visit: Payer: Self-pay

## 2020-07-17 DIAGNOSIS — I10 Essential (primary) hypertension: Secondary | ICD-10-CM

## 2020-07-17 NOTE — Patient Instructions (Signed)
Visit Information  Patient Care Plan: CCM RN- Hypertension (Adult)    Problem Identified: Hypertension (Hypertension)   Priority: High    Goal: Hypertension Monitored   Start Date: 04/06/2020  This Visit's Progress: On track  Priority: High  Note:   CARE PLAN ENTRY (see longitudinal plan of care for additional care plan information)  Objective:  . Last practice recorded BP readings:  BP Readings from Last 3 Encounters:  03/23/20 (!) 162/103  02/04/13 121/83  01/28/13 127/71 .   [name] Most recent eGFR/CrCl: No results found for: EGFR  No components found for: CRCL  Current Barriers:  [name] Knowledge Deficits related to basic understanding of hypertension pathophysiology and self care management . Knowledge Deficits related to understanding of medications prescribed for management of hypertension . 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 She states she now has a wrist monitor and has been self monitoring her blood pressure. She says all readings have been labeled "high normal" with systolic readings in the 254-270 range and diastolic in the 62-37 range. She says she has been out or her medications for "about a month", says she forgot to call the [facility] pharmacy to see if they would mail her medications to her as was suggested by this CCM RN on last month's  call, she also says she needs refills on her medications and is asking how to get them, reports she has not made a follow up clinic appointment for BP recheck even though she was approved for transportation assistance via the [place] (TAMS)   Case Manager Clinical Goal(s):  [name] Over the next 30-60 days, patient will verbalize understanding of plan for hypertension management . Over the next 30-60 days, patient will attend all scheduled medical appointments:  . Over the next 30-60 days, patient will demonstrate improved adherence to prescribed  treatment plan for hypertension as evidenced by taking all medications as prescribed, monitoring and recording blood pressure as directed, adhering to low sodium/DASH diet  Interventions:  . Evaluation of current treatment plan related to hypertension self management and patient's adherence to plan as established by provider. . Reviewed medications with patient and discussed importance of compliance . Discussed risks to her health of not getting refills on medicines in a timely manner  . Instructed patient  to call [facility] pharmacy today for refills and to ask if medications can be mailed to her home  . Reviewed HTN self management strategies including: taking medications as prescribed, smoking cessation, limiting sodium to a teaspoon per day by using lite salt or herbs, rinsing canned vegetables in water prior to heating, avoiding or limiting consumption of  packaged foods/microwave meals and frozen foods with sauces, managing stress and exercise . Positive reinforcement given to her for self monitoring measure her blood pressures . Reviewed home blood pressure readings and reviewed treatment targets for both diastolic and systolic readings . Again encouraged her to make a follow up clinic appointment and suggested it could be via telehealth as long a she has self monitored blood pressure readings to share with her provider . Discussed plans with patient for ongoing care management follow up and provided patient with direct contact information for care management team . Reviewed scheduled/upcoming provider appointments including: reinforced need to make follow up clinic appointment  Patient Self Care Activities:  . UNABLE to independently:control blood pressure to meet targets . Self administers medications as prescribed . Attends all scheduled provider appointments .  Calls provider office for new concerns, questions, or BP outside discussed parameters . Checks BP and records as  discussed . Follows a low sodium diet/DASH diet   Patient Care Plan: CCM RN- Wellness (Adult)    Problem Identified: Barriers to Treatment     Goal: Barriers to Treatment Identified and Managed- Patient has no health insurance or income , social security disabilty pending, has not applied for Medicaid   Start Date: 07/10/2020  Expected End Date: 12/24/2020  This Visit's Progress: On track  Priority: High  Note:   Current Barriers:  . 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 Unable to independently apply for Medicaid  Nurse Case Manager Clinical Goal(s):  [name] Over the next 30-90 days, patient will work with community care guide to address needs related to applying for Medicaid  Interventions:  . 1:1 collaboration with Mitzi Hansen, MD regarding development and update of comprehensive plan of care as evidenced by provider attestation and co-signature . Inter-disciplinary care team collaboration (see longitudinal plan of care) . Care Guide referral for assistance with applying for Medicaid  Patient Goals/Self-Care Activities Over the next 30-60 days, patient will:  - Patient will work with community care guide to address care coordination needs related to applying for Medicaid   Follow Up Plan: The care management team will reach out to the patient again over the next 30-60 days.          Ms. Bremner was given information about Chronic Care Management services today including:  1. CCM service includes personalized support from designated clinical staff supervised by her physician, including individualized plan of care and coordination with other care providers 2. 24/7 contact phone numbers for assistance for urgent and routine care needs. 3. Service will only be billed when office clinical staff spend 20 minutes or more in a month to coordinate  care. 4. Only one practitioner may furnish and bill the service in a calendar month. 5. The patient may stop CCM services at any time (effective at the end of the month) by phone call to the office staff. 6. The patient will be responsible for cost sharing (co-pay) of up to 20% of the service fee (after annual deductible is met).  Patient agreed to services and verbal consent obtained.   Patient verbalizes understanding of instructions provided today and agrees to view in [place].   The care management team will reach out to the patient again over the next 14 days.   Ethelda Chick

## 2020-07-17 NOTE — Chronic Care Management (AMB) (Signed)
Chronic Care Management    Social Work Note  07/17/2020 Name: [name] MRN: [id number] DOB: Jun 30, 1965  [name] is a 55 y.o. year old female who is a primary care patient of [name], [name], MD. The CCM team was consulted to assist the patient with chronic disease management and/or care coordination needs related to: assistance with applying for Medicaid.   Engaged with patient Engaged with patient by telephone for initial visit in response to provider referral for social work chronic care management and care coordination services.   Consent to Services:  Patient was given the following information about Chronic Care Management services today, agreed to services, and gave verbal consent: 1. CCM service includes personalized support from designated clinical staff supervised by primary care provider, including individualized plan of care and coordination with other care providers 2. 24/7 contact phone numbers for assistance for urgent and routine care needs. 3. Service will only be billed when office clinical staff spend 20 minutes or more in a month to coordinate care. 4. Only one practitioner may furnish and bill the service in a calendar month. 5.The patient may stop CCM services at any time (effective at the end of the month) by phone call to the office staff. 6. The patient will be responsible for cost sharing (co-pay) of up to 20% of the service fee (after annual deductible is met). Patient agreed to services and consent obtained.  Patient agreed to services and consent obtained.   Assessment/Interventions: Review of patient past medical history, allergies, medications, and health status, including review of relevant consultants reports was performed today as part of a comprehensive evaluation and provision of chronic care management and care coordination services.     SDOH (Social Determinants of Health) assessments and interventions performed:    BSW provided patient with the  phone number [phone] to apply for Medicaid. Patient stated no other resources were needed at this time.  Advanced Directives Status: Not addressed in this encounter.  CCM Care Plan  No Known Allergies  _0 @  Patient Active Problem List   Diagnosis Date Noted  . History of stroke 03/24/2020  . Prediabetes 03/24/2020  . Polycythemia 03/24/2020  . Financial difficulties 03/24/2020  . Dyspnea on exertion 03/24/2020  . Urge incontinence 03/24/2020  . Hyperlipidemia with target LDL less than 70 03/24/2020  . Protein-calorie malnutrition, severe ([place]) 01/25/2013  . Essential hypertension 07/23/2007    Conditions to be addressed/monitored per PCP order: none; assistance with applying for Medicaid  Patient Care Plan: CCM RN- Hypertension (Adult)    Problem Identified: Hypertension (Hypertension)   Priority: High    Goal: Hypertension Monitored   Start Date: 04/06/2020  This Visit's Progress: On track  Priority: High  Note:   CARE PLAN ENTRY (see longitudinal plan of care for additional care plan information)  Objective:  . Last practice recorded BP readings:  BP Readings from Last 3 Encounters:  03/23/20 (!) 162/103  02/04/13 121/83  01/28/13 127/71 .   [name] Most recent eGFR/CrCl: No results found for: EGFR  No components found for: CRCL  Current Barriers:  [name] Knowledge Deficits related to basic understanding of hypertension pathophysiology and self care management . Knowledge Deficits related to understanding of medications prescribed for management of hypertension . 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 She states she now has a wrist monitor and has been self monitoring her blood pressure. She says all readings have been labeled "high normal" with systolic readings  in the 130-140 range and diastolic in the 75-10 range. She says she has been out or her medications for "about a month", says she forgot to call the  [facility] to see if they would mail her medications to her as was suggested by this CCM RN on last month's  call, she also says she needs refills on her medications and is asking how to get them, reports she has not made a follow up clinic appointment for BP recheck even though she was approved for transportation assistance via the [place] ([employer])   Case Manager Clinical Goal(s):  [name] Over the next 30-60 days, patient will verbalize understanding of plan for hypertension management . Over the next 30-60 days, patient will attend all scheduled medical appointments:  . Over the next 30-60 days, patient will demonstrate improved adherence to prescribed treatment plan for hypertension as evidenced by taking all medications as prescribed, monitoring and recording blood pressure as directed, adhering to low sodium/DASH diet  Interventions:  . Evaluation of current treatment plan related to hypertension self management and patient's adherence to plan as established by provider. . Reviewed medications with patient and discussed importance of compliance . Discussed risks to her health of not getting refills on medicines in a timely manner  . Instructed patient  to call [facility] pharmacy today for refills and to ask if medications can be mailed to her home  . Reviewed HTN self management strategies including: taking medications as prescribed, smoking cessation, limiting sodium to a teaspoon per day by using lite salt or herbs, rinsing canned vegetables in water prior to heating, avoiding or limiting consumption of  packaged foods/microwave meals and frozen foods with sauces, managing stress and exercise . Positive reinforcement given to her for self monitoring measure her blood pressures . Reviewed home blood pressure readings and reviewed treatment targets for both diastolic and systolic readings . Again encouraged her to make a follow up clinic appointment and  suggested it could be via telehealth as long a she has self monitored blood pressure readings to share with her provider . Discussed plans with patient for ongoing care management follow up and provided patient with direct contact information for care management team . Reviewed scheduled/upcoming provider appointments including: reinforced need to make follow up clinic appointment  Patient Self Care Activities:  . UNABLE to independently:control blood pressure to meet targets . Self administers medications as prescribed . Attends all scheduled provider appointments . Calls provider office for new concerns, questions, or BP outside discussed parameters . Checks BP and records as discussed . Follows a low sodium diet/DASH diet   Patient Care Plan: CCM RN- Wellness (Adult)    Problem Identified: Barriers to Treatment     Goal: Barriers to Treatment Identified and Managed- Patient has no health insurance or income , social security disabilty pending, has not applied for Medicaid   Start Date: 07/10/2020  Expected End Date: 12/24/2020  This Visit's Progress: On track  Priority: High  Note:   Current Barriers:  . 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 Unable to independently apply for Medicaid  Nurse Case Manager Clinical Goal(s):  [name] Over the next 30-90 days, patient will work with community care guide to address needs related to applying for Medicaid  Interventions:  . 1:1 collaboration with Mitzi Hansen, MD regarding development and update of comprehensive  plan of care as evidenced by provider attestation and co-signature . Inter-disciplinary care team collaboration (see longitudinal plan of care) . Care Guide referral for assistance with applying for Medicaid  Patient Goals/Self-Care Activities Over the next 30-60 days, patient will:  - Patient will work  with community care guide to address care coordination needs related to applying for Medicaid   Follow Up Plan: The care management team will reach out to the patient again over the next 30-60 days.           Follow Up Plan: SW will follow up with patient by phone over the next 14 days      Mickel Fuchs, [place], [place] [phone]

## 2020-07-20 ENCOUNTER — Other Ambulatory Visit: Payer: Self-pay | Admitting: Internal Medicine

## 2020-07-21 ENCOUNTER — Other Ambulatory Visit: Payer: Self-pay | Admitting: Student

## 2020-07-21 MED ORDER — LOSARTAN POTASSIUM 25 MG PO TABS: ORAL_TABLET | ORAL | 0 refills | Status: DC

## 2020-07-21 MED FILL — LOSARTAN POTASSIUM 25 MG TA: 25 | 30 days supply | Qty: 30 | Fill #0

## 2020-07-21 NOTE — Addendum Note (Signed)
Addended by: Quincy Simmonds on: 07/21/2020 10:33 AM   Modules accepted: Orders

## 2020-07-23 ENCOUNTER — Other Ambulatory Visit: Payer: Self-pay | Admitting: Internal Medicine

## 2020-07-23 MED FILL — HYDROCHLOROTHIAZIDE 12.5 MG: 12.5 | 30 days supply | Qty: 60 | Fill #0

## 2020-08-06 ENCOUNTER — Ambulatory Visit: Payer: Self-pay

## 2020-08-06 ENCOUNTER — Telehealth: Payer: Self-pay

## 2020-08-06 NOTE — Chronic Care Management (AMB) (Signed)
Chronic Care Management    Social Work Note  08/06/2020 Name: [name] MRN: [id number] DOB: 09/16/1965  [name] is a 55 y.o. year old female who is a primary care patient of [name], Rylee, MD. The CCM team was consulted to assist the patient with chronic disease management and/or care coordination needs related to: Medicaid information.   Engaged with patient Engaged with patient by telephone for follow up visit in response to provider referral for social work chronic care management and care coordination services.   Consent to Services:  Patient was given the following information about Chronic Care Management services today, agreed to services, and gave verbal consent: 1. CCM service includes personalized support from designated clinical staff supervised by primary care provider, including individualized plan of care and coordination with other care providers 2. 24/7 contact phone numbers for assistance for urgent and routine care needs. 3. Service will only be billed when office clinical staff spend 20 minutes or more in a month to coordinate care. 4. Only one practitioner may furnish and bill the service in a calendar month. 5.The patient may stop CCM services at any time (effective at the end of the month) by phone call to the office staff. 6. The patient will be responsible for cost sharing (co-pay) of up to 20% of the service fee (after annual deductible is met). Patient agreed to services and consent obtained.  Patient agreed to services and consent obtained.   Assessment/Interventions: Review of patient past medical history, allergies, medications, and health status, including review of relevant consultants reports was performed today as part of a comprehensive evaluation and provision of chronic care management and care coordination services.     SDOH (Social Determinants of Health) assessments and interventions performed:    Patient stated she was doing well and did  apply for Medicaid. She has not heard anything back from them yet. BSW informed patient it can take up to 45 days for them to make a decision. Advanced Directives Status: Not addressed in this encounter.  CCM Care Plan  No Known Allergies  '@[email or web address]' @  Patient Active Problem List   Diagnosis Date Noted  . History of stroke 03/24/2020  . Prediabetes 03/24/2020  . Polycythemia 03/24/2020  . Financial difficulties 03/24/2020  . Dyspnea on exertion 03/24/2020  . Urge incontinence 03/24/2020  . Hyperlipidemia with target LDL less than 70 03/24/2020  . Protein-calorie malnutrition, severe ([place]) 01/25/2013  . Essential hypertension 07/23/2007    Conditions to be addressed/monitored per PCP order: N/a; Applying for Medicaid  Patient Care Plan: CCM RN- Hypertension (Adult)    Problem Identified: Hypertension (Hypertension)   Priority: High    Goal: Hypertension Monitored   Start Date: 04/06/2020  This Visit's Progress: On track  Priority: High  Note:   CARE PLAN ENTRY (see longitudinal plan of care for additional care plan information)  Objective:  . Last practice recorded BP readings:  BP Readings from Last 3 Encounters:  03/23/20 (!) 162/103  02/04/13 121/83  01/28/13 127/71 .   [name] Most recent eGFR/CrCl: No results found for: EGFR  No components found for: CRCL  Current Barriers:  [name] Knowledge Deficits related to basic understanding of hypertension pathophysiology and self care management . Knowledge Deficits related to understanding of medications prescribed for management of hypertension . 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 She states she now has a wrist monitor and has been self monitoring her blood pressure. She says all  readings have been labeled "high normal" with systolic readings in the 751-700 range and diastolic in the 17-49 range. She says she has been out or her medications for "about a month", says she forgot  to call the [facility] to see if they would mail her medications to her as was suggested by this CCM RN on last month's  call, she also says she needs refills on her medications and is asking how to get them, reports she has not made a follow up clinic appointment for BP recheck even though she was approved for transportation assistance via the [place] ([employer])   Case Manager Clinical Goal(s):  [name] Over the next 30-60 days, patient will verbalize understanding of plan for hypertension management . Over the next 30-60 days, patient will attend all scheduled medical appointments:  . Over the next 30-60 days, patient will demonstrate improved adherence to prescribed treatment plan for hypertension as evidenced by taking all medications as prescribed, monitoring and recording blood pressure as directed, adhering to low sodium/DASH diet  Interventions:  . Evaluation of current treatment plan related to hypertension self management and patient's adherence to plan as established by provider. . Reviewed medications with patient and discussed importance of compliance . Discussed risks to her health of not getting refills on medicines in a timely manner  . Instructed patient  to call [facility] today for refills and to ask if medications can be mailed to her home  . Reviewed HTN self management strategies including: taking medications as prescribed, smoking cessation, limiting sodium to a teaspoon per day by using lite salt or herbs, rinsing canned vegetables in water prior to heating, avoiding or limiting consumption of  packaged foods/microwave meals and frozen foods with sauces, managing stress and exercise . Positive reinforcement given to her for self monitoring measure her blood pressures . Reviewed home blood pressure readings and reviewed treatment targets for both diastolic and systolic readings . Again encouraged her to make a follow up clinic  appointment and suggested it could be via telehealth as long a she has self monitored blood pressure readings to share with her provider . Discussed plans with patient for ongoing care management follow up and provided patient with direct contact information for care management team . Reviewed scheduled/upcoming provider appointments including: reinforced need to make follow up clinic appointment  Patient Self Care Activities:  . UNABLE to independently:control blood pressure to meet targets . Self administers medications as prescribed . Attends all scheduled provider appointments . Calls provider office for new concerns, questions, or BP outside discussed parameters . Checks BP and records as discussed . Follows a low sodium diet/DASH diet   Patient Care Plan: CCM RN- Wellness (Adult)    Problem Identified: Barriers to Treatment     Goal: Barriers to Treatment Identified and Managed- Patient has no health insurance or income , social security disabilty pending, has not applied for Medicaid   Start Date: 07/10/2020  Expected End Date: 12/24/2020  This Visit's Progress: On track  Priority: High  Note:   Current Barriers:  . 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 Unable to independently apply for Medicaid  Nurse Case Manager Clinical Goal(s):  [name] Over the next 30-90 days, patient will work with community care guide to address needs related to applying for Medicaid  Interventions:  . 1:1 collaboration with  Mitzi Hansen, MD regarding development and update of comprehensive plan of care as evidenced by provider attestation and co-signature . Inter-disciplinary care team collaboration (see longitudinal plan of care) . Care Guide referral for assistance with applying for Medicaid  Patient Goals/Self-Care Activities Over the next 30-60 days, patient will:  -  Patient will work with community care guide to address care coordination needs related to applying for Medicaid   Follow Up Plan: The care management team will reach out to the patient again over the next 30-60 days.           Follow Up Plan: No further follow up needed. Patient stated no other resoures were needed at this time. BSW provided patient with her information.     Mickel Fuchs, BSW, [facility]

## 2020-08-06 NOTE — Patient Instructions (Signed)
Visit Information  PATIENT GOALS: Goals Addressed   None     The patient verbalized understanding of instructions, educational materials, and care plan provided today and agreed to receive a mailed copy of patient instructions, educational materials, and care plan.   No further follow up required: patient was provided with BSW's information.  Gus Puma, BSW, [facility]  [facility]

## 2020-08-10 ENCOUNTER — Ambulatory Visit: Payer: Self-pay | Admitting: *Deleted

## 2020-08-10 DIAGNOSIS — D751 Secondary polycythemia: Secondary | ICD-10-CM

## 2020-08-10 DIAGNOSIS — Z8673 Personal history of transient ischemic attack (TIA), and cerebral infarction without residual deficits: Secondary | ICD-10-CM

## 2020-08-10 DIAGNOSIS — I10 Essential (primary) hypertension: Secondary | ICD-10-CM

## 2020-08-10 NOTE — Chronic Care Management (AMB) (Signed)
Care Management    RN Visit Note  08/10/2020 Name: [name] MRN: [id number] DOB: [birth year]  Subjective: [name] is a 55 y.o. year old female who is a primary care patient of [name], [name], MD. The care management team was consulted for assistance with disease management and care coordination needs.    Engaged with patient by telephone for follow up visit in response to provider referral for case management and/or care coordination services.   Consent to Services:   [name] was given information about Care Management services today including:  1. Care Management services includes personalized support from designated clinical staff supervised by her physician, including individualized plan of care and coordination with other care providers 2. 24/7 contact phone numbers for assistance for urgent and routine care needs. 3. The patient may stop case management services at any time by phone call to the office staff.  Patient agreed to services and consent obtained.   Assessment: Review of patient past medical history, allergies, medications, health status, including review of consultants reports, laboratory and other test data, was performed as part of comprehensive evaluation and provision of chronic care management services.   SDOH (Social Determinants of Health) assessments and interventions performed:    Care Plan  No Known Allergies  Outpatient Encounter Medications as of 08/10/2020  Medication Sig  . hydrochlorothiazide (MICROZIDE) 12.5 MG capsule TAKE 2 CAPSULES (25 MG TOTAL) BY MOUTH DAILY. IM PROGRAM  . losartan (COZAAR) 25 MG tablet TAKE 1 TABLET (25 MG TOTAL) BY MOUTH DAILY.   No facility-administered encounter medications on file as of 08/10/2020.    Patient Active Problem List   Diagnosis Date Noted  . History of stroke 03/24/2020  . Prediabetes 03/24/2020  . Polycythemia 03/24/2020  . Financial difficulties 03/24/2020  . Dyspnea on exertion  03/24/2020  . Urge incontinence 03/24/2020  . Hyperlipidemia with target LDL less than 70 03/24/2020  . Protein-calorie malnutrition, severe ([place]) 01/25/2013  . 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 Last practice recorded BP readings:  BP Readings from Last 3 Encounters:  03/23/20 (!) 162/103  02/04/13 121/83  01/28/13 127/71 .   [name] Most recent eGFR/CrCl: No results found for: EGFR  No components found for: CRCL  Current Barriers:  [name] Knowledge Deficits related to basic understanding of hypertension pathophysiology and self care management . Knowledge Deficits related to understanding of medications prescribed for management of hypertension . 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 She states she now has a wrist monitor and has been self monitoring her blood pressure. She says the majority of readings are meeting treatment targets, she says she called the [facility] pharmacy to see if they would mail her medications to her but they are not able to do this, she says transportation to the pharmacy is no longer an issue as she is using [facility] ([place]) , she says the reason she has not made  a follow up clinic appointment is due to the copay , she says she completed a Medicaid application and the decision is pending, she says she has an examination with a disability determination MD next week as she is  applying for long term disability  Case Manager Clinical Goal(s):  [name] Over the next 30-60 days, patient will verbalize understanding of plan for hypertension management . Over the next 30-60 days, patient will attend all scheduled medical appointments:  . Over the next 30-60 days, patient will demonstrate improved adherence to prescribed treatment plan for hypertension as evidenced by taking all medications as prescribed, monitoring and recording blood pressure as directed, adhering to low sodium/DASH diet  Interventions:  . Evaluation of current treatment plan related to hypertension self management and patient's adherence to plan as established by provider. . Reviewed medications with patient and assessed medication taking behavior . Reviewed HTN self management strategies including: taking medications as prescribed, smoking cessation, limiting sodium to a teaspoon per day by using lite salt or herbs, rinsing canned vegetables in water prior to heating, avoiding or limiting consumption of  packaged foods/microwave meals and frozen foods with sauces, managing stress and exercise . Positive reinforcement given to her for self monitoring measure her blood pressures . Reviewed home blood pressure readings and reviewed treatment targets for both diastolic and systolic readings . Will encourage patient  her to make a follow up clinic appointment when the Medicaid determination comes through  . Discussed plans with patient for ongoing care management follow up and provided patient with direct contact information for care management team . Reviewed scheduled/upcoming provider appointments including: reinforced need to make follow up clinic appointment once Medicaid status is known   Patient Self Care Activities:  . UNABLE to independently:control blood pressure to meet targets . Self administers medications as prescribed . Attends all scheduled provider appointments . Calls provider office for new  concerns, questions, or BP outside discussed parameters . Checks BP and records as discussed . Follows a low sodium diet/DASH diet   Care Plan : CCM RN- Wellness (Adult)  Updates made by Barrington Ellison, RN since 08/10/2020 12:00 AM    Problem: Barriers to Treatment     Goal: Barriers to Treatment Identified and Managed- Patient has no health insurance or income , social security disability and Medicaid pending   Start Date: 07/10/2020  Expected End Date: 12/24/2020  This Visit's Progress: On track  Recent Progress: On track  Priority: High  Note:   Current Barriers:  . 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 Unable to independently apply for Medicaid  Nurse Case Manager Clinical Goal(s):  [name] Over the next 30-90 days, patient will work with community care guide to address needs related to applying for Medicaid  Interventions:  . 1:1 collaboration with Mitzi Hansen, MD regarding development and update of comprehensive plan of care as evidenced by provider attestation and co-signature . Inter-disciplinary care team collaboration (see longitudinal plan of care) . Assessed status of Medicaid application-  SW Mickel Fuchs assisted patient with applying for Medicaid and the decision is pending . Assessed status of long term disability application . Advised patient to keep CCM RN updated on status of Medicaid and long term disability decision  Patient Goals/Self-Care Activities Over the next 30-60 days, patient will:  - Patient will work with CCM BSW to address care coordination needs related to  applying for Medicaid   Follow Up Plan: The care management team will reach out to the patient again over the next 30-60 days.         Kelli Churn RN, CCM, [facility] RN Care Manager [phone]

## 2020-08-10 NOTE — Patient Instructions (Signed)
Visit Information It was nice speaking with you today.   Patient Care Plan: CCM RN- Hypertension (Adult)    Problem Identified: Hypertension (Hypertension)   Priority: High    Goal: Hypertension Monitored   Start Date: 04/06/2020  Recent Progress: On track  Priority: High  Note:   CARE PLAN ENTRY (see longitudinal plan of care for additional care plan information)  Objective:  . Last practice recorded BP readings:  BP Readings from Last 3 Encounters:  03/23/20 (!) 162/103  02/04/13 121/83  01/28/13 127/71 .   [name] Most recent eGFR/CrCl: No results found for: EGFR  No components found for: CRCL  Current Barriers:  [name] Knowledge Deficits related to basic understanding of hypertension pathophysiology and self care management . Knowledge Deficits related to understanding of medications prescribed for management of hypertension . 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 She states she now has a wrist monitor and has been self monitoring her blood pressure. She says the majority of readings are meeting treatment targets, she says she called the [facility] pharmacy to see if they would mail her medications to her but they are not able to do this, she says transportation to the pharmacy is no longer an issue as she is using [employer]) , she says the reason she has not made a follow up clinic appointment is due to the copay , she says she completed a Medicaid application and the decision is pending, she says she has an examination with a disability determination MD next week as she is applying for long term disability  Case Manager Clinical Goal(s):  [name] Over the next 30-60 days, patient will verbalize understanding of plan for hypertension management . Over the next 30-60 days, patient will attend all scheduled medical appointments:  . Over the next 30-60 days, patient will demonstrate improved adherence  to prescribed treatment plan for hypertension as evidenced by taking all medications as prescribed, monitoring and recording blood pressure as directed, adhering to low sodium/DASH diet  Interventions:  . Evaluation of current treatment plan related to hypertension self management and patient's adherence to plan as established by provider. . Reviewed medications with patient and assessed medication taking behavior . Reviewed HTN self management strategies including: taking medications as prescribed, smoking cessation, limiting sodium to a teaspoon per day by using lite salt or herbs, rinsing canned vegetables in water prior to heating, avoiding or limiting consumption of  packaged foods/microwave meals and frozen foods with sauces, managing stress and exercise . Positive reinforcement given to her for self monitoring measure her blood pressures . Reviewed home blood pressure readings and reviewed treatment targets for both diastolic and systolic readings . Will encourage patient  her to make a follow up clinic appointment when the Medicaid determination comes through  . Discussed plans with patient for ongoing care management follow up and provided patient with direct contact information for care management team . Reviewed scheduled/upcoming provider appointments including: reinforced need to make follow up clinic appointment once Medicaid status is known  Patient Self Care Activities:  . UNABLE to independently:control blood pressure to meet targets . Self administers medications as prescribed . Attends all scheduled provider appointments . Calls provider office for new concerns, questions, or BP outside discussed parameters . Checks BP and records as discussed . Follows a low sodium diet/DASH diet   Patient Care Plan: CCM RN- Wellness (Adult)    Problem Identified: Barriers to Treatment  Goal: Barriers to Treatment Identified and Managed- Patient has no health insurance or income ,  social security disability and Medicaid pending   Start Date: 07/10/2020  Expected End Date: 12/24/2020  This Visit's Progress: On track  Recent Progress: On track  Priority: High  Note:   Current Barriers:  . 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 Unable to independently apply for Medicaid  Nurse Case Manager Clinical Goal(s):  [name] Over the next 30-90 days, patient will work with community care guide to address needs related to applying for Medicaid  Interventions:  . 1:1 collaboration with Mitzi Hansen, MD regarding development and update of comprehensive plan of care as evidenced by provider attestation and co-signature . Inter-disciplinary care team collaboration (see longitudinal plan of care) . Assessed status of Medicaid application-  SW Mickel Fuchs assisted patient with applying for Medicaid and the decision is pending . Assessed status of long term disability application . Advised patient to keep CCM RN updated on status of Medicaid and long term disability decision  Patient Goals/Self-Care Activities Over the next 30-60 days, patient will:  - Patient will work with CCM BSW to address care coordination needs related to applying for Medicaid   Follow Up Plan: The care management team will reach out to the patient again over the next 30-60 days.          The patient verbalized understanding of instructions, educational materials, and care plan provided today and declined offer to receive copy of patient instructions, educational materials, and care plan.   The care management team will reach out to the patient again over the next 30-60 days.   Kelli Churn RN, CCM, [facility] RN Care Manager [phone]

## 2020-08-11 NOTE — Progress Notes (Signed)
Internal Medicine Clinic Resident  I have personally reviewed this encounter including the documentation in this note and/or discussed this patient with the care management provider. I will address any urgent items identified by the care management provider and will communicate my actions to the patient's PCP. I have reviewed the patient's CCM visit with my supervising attending, Dr Mullen.  Tara Shelba Susi, MD 08/11/2020   

## 2020-08-12 MED FILL — LOSARTAN POTASSIUM 25 MG TA: 25 | 30 days supply | Qty: 30 | Fill #0

## 2020-08-12 MED FILL — HYDROCHLOROTHIAZIDE 12.5 MG: 12.5 | 30 days supply | Qty: 60 | Fill #0

## 2020-08-12 NOTE — Progress Notes (Signed)
Internal Medicine Clinic Attending  CCM services provided by the care management provider and their documentation were discussed with Dr. Evie Lacks. We reviewed the pertinent findings, urgent action items addressed by the resident and non-urgent items to be addressed by the PCP.  I agree with the assessment, diagnosis, and plan of care documented in the CCM and resident's note.  Debe Coder, MD 08/12/2020

## 2020-08-25 ENCOUNTER — Encounter: Payer: Self-pay | Admitting: Internal Medicine

## 2020-08-25 ENCOUNTER — Telehealth: Payer: Self-pay | Admitting: Internal Medicine

## 2020-08-25 NOTE — Telephone Encounter (Signed)
Opened in error

## 2020-09-07 ENCOUNTER — Telehealth: Payer: Self-pay

## 2020-09-08 ENCOUNTER — Telehealth: Payer: Self-pay

## 2020-09-08 ENCOUNTER — Telehealth: Payer: Self-pay | Admitting: *Deleted

## 2020-09-08 NOTE — Telephone Encounter (Signed)
  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 The patient was referred to the case management team for assistance with care management and care coordination.   Follow Up Plan: A HIPAA compliant phone message was left for the patient providing contact information and requesting a return call.  The care management team will reach out to the patient again over the next 7-14 days.   Cranford Mon RN, CCM, [facility] Clinic RN Care Manager [phone]

## 2020-09-11 ENCOUNTER — Other Ambulatory Visit: Payer: Self-pay | Admitting: Student

## 2020-09-15 ENCOUNTER — Other Ambulatory Visit: Payer: Self-pay | Admitting: Internal Medicine

## 2020-09-15 ENCOUNTER — Encounter: Payer: Self-pay | Admitting: Internal Medicine

## 2020-09-15 MED ORDER — LOSARTAN POTASSIUM 25 MG PO TABS: 25.0000 mg | ORAL_TABLET | Freq: Every day | ORAL | 0 refills | Status: DC

## 2020-09-15 MED FILL — LOSARTAN POTASSIUM 25 MG TA: 25 | 30 days supply | Qty: 30 | Fill #0

## 2020-09-15 NOTE — Telephone Encounter (Signed)
Patient mailed letter asking to contact the clinic to schedule an appointment. 

## 2020-09-21 ENCOUNTER — Ambulatory Visit: Payer: Self-pay | Admitting: *Deleted

## 2020-09-21 DIAGNOSIS — Z8673 Personal history of transient ischemic attack (TIA), and cerebral infarction without residual deficits: Secondary | ICD-10-CM

## 2020-09-21 DIAGNOSIS — D751 Secondary polycythemia: Secondary | ICD-10-CM

## 2020-09-21 DIAGNOSIS — I10 Essential (primary) hypertension: Secondary | ICD-10-CM

## 2020-09-21 DIAGNOSIS — E785 Hyperlipidemia, unspecified: Secondary | ICD-10-CM

## 2020-09-21 NOTE — Chronic Care Management (AMB) (Signed)
Care Management    RN Visit Note  09/21/2020 Name: [name] MRN: [id number] DOB: Jan 19, 1966  Subjective: [name] is a 55 y.o. year old female who is a primary care patient of [name], [name], MD. The care management team was consulted for assistance with disease management and care coordination needs.    Engaged with patient by telephone for follow up visit in response to provider referral for case management and/or care coordination services.   Consent to Services:   [name] was given information about Care Management services today including:  1. Care Management services includes personalized support from designated clinical staff supervised by her physician, including individualized plan of care and coordination with other care providers 2. 24/7 contact phone numbers for assistance for urgent and routine care needs. 3. The patient may stop case management services at any time by phone call to the office staff.  Patient agreed to services and consent obtained.   Assessment: Review of patient past medical history, allergies, medications, health status, including review of consultants reports, laboratory and other test data, was performed as part of comprehensive evaluation and provision of chronic care management services.   SDOH (Social Determinants of Health) assessments and interventions performed:    Care Plan  No Known Allergies  Outpatient Encounter Medications as of 09/21/2020  Medication Sig  . hydrochlorothiazide (MICROZIDE) 12.5 MG capsule TAKE 2 CAPSULES (25 MG TOTAL) BY MOUTH DAILY. IM PROGRAM  . losartan (COZAAR) 25 MG tablet Take 1 tablet (25 mg total) by mouth daily.   No facility-administered encounter medications on file as of 09/21/2020.    Patient Active Problem List   Diagnosis Date Noted  . History of stroke 03/24/2020  . Prediabetes 03/24/2020  . Polycythemia 03/24/2020  . Financial difficulties 03/24/2020  . Dyspnea on exertion  03/24/2020  . Urge incontinence 03/24/2020  . Hyperlipidemia with target LDL less than 70 03/24/2020  . Protein-calorie malnutrition, severe ([place]) 01/25/2013  . 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 Last practice recorded BP readings:  BP Readings from Last 3 Encounters:  03/23/20 (!) 162/103  02/04/13 121/83  01/28/13 127/71 .   [name] Most recent eGFR/CrCl: No results found for: EGFR  No components found for: CRCL  Current Barriers:  [name] Knowledge Deficits related to basic understanding of hypertension pathophysiology and self care management . Knowledge Deficits related to understanding of medications prescribed for management of hypertension . 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 She states she now has a wrist monitor and has been self monitoring her blood pressure. She says the majority of readings are meeting treatment targets, she says transportation to the pharmacy is no longer an issue as she is using [employer]) , she reports good medication taking behavior, she says the reason she has not made a follow up clinic appointment is due to the copay , she says she completed a Medicaid application and the decision  is pending, she says she has an examination with a disability determination MD at the end of February and the decision regarding eligibility for long term disability is  pending  Case Manager Clinical Goal(s):  [name] Over the next 30-60 days, patient will verbalize understanding of plan for hypertension management . Over the next 30-60 days, patient will attend all scheduled medical appointments:  . Over the next 30-60 days, patient will demonstrate improved adherence to prescribed treatment plan for hypertension as evidenced by taking all medications as prescribed, monitoring and recording blood pressure as directed, adhering to low sodium/DASH diet  Interventions:  . Evaluation of current treatment plan related to hypertension self management and patient's adherence to plan as established by provider. . Reviewed medications with patient and assessed medication taking behavior . Reviewed HTN self management strategies including: taking medications as prescribed, smoking cessation, limiting sodium to a teaspoon per day by using lite salt or herbs, rinsing canned vegetables in water prior to heating, avoiding or limiting consumption of  packaged foods/microwave meals and frozen foods with sauces, managing stress and exercise . Positive reinforcement given to her for self monitoring measure her blood pressures . Reviewed home blood pressure readings and reviewed treatment targets for both diastolic and systolic readings . Will encourage patient  her to make a follow up clinic appointment when the Medicaid determination comes through  . Discussed plans with patient for ongoing care management follow up and provided patient with direct contact information for care management team . Reviewed scheduled/upcoming provider appointments including: reinforced need to make follow up clinic appointment once Medicaid status is known  Patient Self Care Activities:  . UNABLE to independently:control blood pressure to meet targets . Self administers medications as prescribed . Attends all scheduled provider appointments . Calls provider office for new concerns, questions, or BP  outside discussed parameters . Checks BP and records as discussed . Follows a low sodium diet/DASH diet   Care Plan : CCM RN- Wellness (Adult)  Updates made by Barrington Ellison, RN since 09/21/2020 12:00 AM    Problem: Barriers to Treatment     Goal: Barriers to Treatment Identified and Managed- Patient has no health insurance or income , social security disability and Medicaid pending   Start Date: 07/10/2020  Expected End Date: 12/24/2020  Recent Progress: On track  Priority: High  Note:   Current Barriers:  . 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 Unable to independently apply for Medicaid - has applied for Medicaid and long term disability , decisions are still pending  Nurse Case Manager Clinical Goal(s):  [name] Over the next 30-90 days, patient will work with community care guide to address needs related to applying for Medicaid  Interventions:  . 1:1 collaboration with Mitzi Hansen, MD regarding development and update of comprehensive plan of care as evidenced by provider attestation and co-signature . Inter-disciplinary care team collaboration (see longitudinal plan of care) . Assessed status of Medicaid application-  SW Mickel Fuchs assisted patient with applying for Medicaid and the decision is pending . Assessed status of long term disability application . Advised patient to keep CCM RN updated on status of Medicaid and long term disability decision  Patient Goals/Self-Care Activities Over the next 30-60 days, patient will:  - Patient will work with CCM BSW to address care coordination needs related to applying for Medicaid   Follow Up Plan:  The care management team will reach out to the patient again over the next 30-60 days.          Kelli Churn RN, CCM, [facility] RN Care Manager [phone]

## 2020-09-21 NOTE — Patient Instructions (Signed)
Visit Information It was nice speaking with you today. Patient Care Plan: CCM RN- Hypertension (Adult)    Problem Identified: Hypertension (Hypertension)   Priority: High    Goal: Hypertension Monitored   Start Date: 04/06/2020  Recent Progress: On track  Priority: High  Note:   CARE PLAN ENTRY (see longitudinal plan of care for additional care plan information)  Objective:  . Last practice recorded BP readings:  BP Readings from Last 3 Encounters:  03/23/20 (!) 162/103  02/04/13 121/83  01/28/13 127/71 .   [name] Most recent eGFR/CrCl: No results found for: EGFR  No components found for: CRCL  Current Barriers:  [name] Knowledge Deficits related to basic understanding of hypertension pathophysiology and self care management . Knowledge Deficits related to understanding of medications prescribed for management of hypertension . 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 She states she now has a wrist monitor and has been self monitoring her blood pressure. She says the majority of readings are meeting treatment targets, she says transportation to the pharmacy is no longer an issue as she is using [facility] [employer]) , she reports good medication taking behavior, she says the reason she has not made a follow up clinic appointment is due to the copay , she says she completed a Medicaid application and the decision is pending, she says she has an examination with a disability determination MD at the end of February and the decision regarding eligibility for long term disability is pending  Case Manager Clinical Goal(s):  [name] Over the next 30-60 days, patient will verbalize understanding of plan for hypertension management . Over the next 30-60 days, patient will attend all scheduled medical appointments:  . Over the next 30-60 days, patient will demonstrate improved adherence to prescribed treatment plan for  hypertension as evidenced by taking all medications as prescribed, monitoring and recording blood pressure as directed, adhering to low sodium/DASH diet  Interventions:  . Evaluation of current treatment plan related to hypertension self management and patient's adherence to plan as established by provider. . Reviewed medications with patient and assessed medication taking behavior . Reviewed HTN self management strategies including: taking medications as prescribed, smoking cessation, limiting sodium to a teaspoon per day by using lite salt or herbs, rinsing canned vegetables in water prior to heating, avoiding or limiting consumption of  packaged foods/microwave meals and frozen foods with sauces, managing stress and exercise . Positive reinforcement given to her for self monitoring measure her blood pressures . Reviewed home blood pressure readings and reviewed treatment targets for both diastolic and systolic readings . Will encourage patient  her to make a follow up clinic appointment when the Medicaid determination comes through  . Discussed plans with patient for ongoing care management follow up and provided patient with direct contact information for care management team . Reviewed scheduled/upcoming provider appointments including: reinforced need to make follow up clinic appointment once Medicaid status is known  Patient Self Care Activities:  . UNABLE to independently:control blood pressure to meet targets . Self administers medications as prescribed . Attends all scheduled provider appointments . Calls provider office for new concerns, questions, or BP outside discussed parameters . Checks BP and records as discussed . Follows a low sodium diet/DASH diet   Patient Care Plan: CCM RN- Wellness (Adult)    Problem Identified: Barriers to Treatment     Goal: Barriers to Treatment Identified and Managed- Patient has no health insurance  or income , social security disability and  Medicaid pending   Start Date: 07/10/2020  Expected End Date: 12/24/2020  Recent Progress: On track  Priority: High  Note:   Current Barriers:  . 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 Unable to independently apply for Medicaid - has applied for Medicaid and long term disability , decisions are still pending  Nurse Case Manager Clinical Goal(s):  [name] Over the next 30-90 days, patient will work with community care guide to address needs related to applying for Medicaid  Interventions:  . 1:1 collaboration with Mitzi Hansen, MD regarding development and update of comprehensive plan of care as evidenced by provider attestation and co-signature . Inter-disciplinary care team collaboration (see longitudinal plan of care) . Assessed status of Medicaid application-  SW Mickel Fuchs assisted patient with applying for Medicaid and the decision is pending . Assessed status of long term disability application . Advised patient to keep CCM RN updated on status of Medicaid and long term disability decision  Patient Goals/Self-Care Activities Over the next 30-60 days, patient will:  - Patient will work with CCM BSW to address care coordination needs related to applying for Medicaid   Follow Up Plan: The care management team will reach out to the patient again over the next 30-60 days.          The patient verbalized understanding of instructions, educational materials, and care plan provided today and declined offer to receive copy of patient instructions, educational materials, and care plan.     Kelli Churn RN, CCM, [facility] RN Care Manager [phone]

## 2020-09-28 ENCOUNTER — Other Ambulatory Visit (HOSPITAL_COMMUNITY): Payer: Self-pay

## 2020-09-28 MED FILL — Losartan Potassium Tab 25 MG: ORAL | 30 days supply | Qty: 30 | Fill #0 | Status: AC

## 2020-09-28 MED FILL — Hydrochlorothiazide Cap 12.5 MG: ORAL | 30 days supply | Qty: 60 | Fill #0 | Status: AC

## 2020-10-23 ENCOUNTER — Telehealth: Payer: Self-pay

## 2020-10-28 LAB — PULMONARY FUNCTION TEST

## 2020-11-02 ENCOUNTER — Other Ambulatory Visit (HOSPITAL_COMMUNITY): Payer: Self-pay

## 2020-11-02 ENCOUNTER — Other Ambulatory Visit: Payer: Self-pay | Admitting: Internal Medicine

## 2020-11-02 ENCOUNTER — Ambulatory Visit: Payer: Self-pay | Admitting: *Deleted

## 2020-11-02 DIAGNOSIS — I10 Essential (primary) hypertension: Secondary | ICD-10-CM

## 2020-11-02 DIAGNOSIS — D751 Secondary polycythemia: Secondary | ICD-10-CM

## 2020-11-02 DIAGNOSIS — E785 Hyperlipidemia, unspecified: Secondary | ICD-10-CM

## 2020-11-02 DIAGNOSIS — Z8673 Personal history of transient ischemic attack (TIA), and cerebral infarction without residual deficits: Secondary | ICD-10-CM

## 2020-11-02 MED FILL — Losartan Potassium Tab 25 MG: ORAL | 30 days supply | Qty: 30 | Fill #0 | Status: AC

## 2020-11-02 NOTE — Patient Instructions (Signed)
Visit Information It was nice speaking with you today. Goals Addressed            This Visit's Progress   . Make and Keep All Appointments       Follow Up Date 12/24/20   - ask family or friend for a ride - call to cancel if needed - keep a calendar with appointment dates  - arrange transportation via [facility] and [employer] [facility]    Why is this important?   Part of staying healthy is seeing the doctor for follow-up care.  If you forget your appointments, there are some things you can do to stay on track.    Notes: Patient states that she did not participate in follow up appointments as needed after stroke and wants to improve. Has not made a follow up clinic appointment due to the copay as Medicaid is pending    . Track and Manage My Blood Pressure-Hypertension       Timeframe:  Long-Range Goal Priority:  High Start Date:       04/06/20                      Expected End Date:                       Follow Up Date 12/24/20   - check blood pressure weekly - write blood pressure results in a log or diary    Why is this important?    You won't feel high blood pressure, but it can still hurt your blood vessels.   High blood pressure can cause heart or kidney problems. It can also cause a stroke.   Making lifestyle changes like losing a little weight or eating less salt will help.   Checking your blood pressure at home and at different times of the day can help to control blood pressure.   If the doctor prescribes medicine remember to take it the way the doctor ordered.   Call the office if you cannot afford the medicine or if there are questions about it.     Notes: states BP readings are high and she cannot afford clinic copay and her land lord dio not get the form notarized so she cannot complete the [employer] application       The patient verbalized understanding of instructions, educational materials, and care plan provided today and  declined offer to receive copy of patient instructions, educational materials, and care plan.   The care management team will reach out to the patient again over the next 30-60 days.   Cranford Mon RN, CCM, [facility] RN Care Manager [phone]

## 2020-11-02 NOTE — Chronic Care Management (AMB) (Signed)
Care Management    RN Visit Note  11/02/2020 Name: [name] MRN: [id number] DOB: 27-Oct-1965  Subjective: [name] is a 55 y.o. year old female who is a primary care patient of [name], Tara Dell, MD. The care management team was consulted for assistance with disease management and care coordination needs.    Engaged with patient by telephone for follow up visit in response to provider referral for case management and/or care coordination services.   Consent to Services:   [name] was given information about Care Management services today including:  1. Care Management services includes personalized support from designated clinical staff supervised by her physician, including individualized plan of care and coordination with other care providers 2. 24/7 contact phone numbers for assistance for urgent and routine care needs. 3. The patient may stop case management services at any time by phone call to the office staff.  Patient agreed to services and consent obtained.   Assessment: Review of patient past medical history, allergies, medications, health status, including review of consultants reports, laboratory and other test data, was performed as part of comprehensive evaluation and provision of chronic care management services.   SDOH (Social Determinants of Health) assessments and interventions performed:    Care Plan  No Known Allergies  Outpatient Encounter Medications as of 11/02/2020  Medication Sig  . hydrochlorothiazide (MICROZIDE) 12.5 MG capsule TAKE 2 CAPSULES (25 MG TOTAL) BY MOUTH DAILY.  [name] losartan (COZAAR) 25 MG tablet TAKE 1 TABLET (25 MG TOTAL) BY MOUTH DAILY.   No facility-administered encounter medications on file as of 11/02/2020.    Patient Active Problem List   Diagnosis Date Noted  . History of stroke 03/24/2020  . Prediabetes 03/24/2020  . Polycythemia 03/24/2020  . Financial difficulties 03/24/2020  . Dyspnea on exertion 03/24/2020  . Urge  incontinence 03/24/2020  . Hyperlipidemia with target LDL less than 70 03/24/2020  . Protein-calorie malnutrition, severe ([place]) 01/25/2013  . 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 Last practice recorded BP readings:  BP Readings from Last 3 Encounters:  03/23/20 (!) 162/103  02/04/13 121/83  01/28/13 127/71 .   [name] Most recent eGFR/CrCl: No results found for: EGFR  No components found for: CRCL  Current Barriers:  [name] Knowledge Deficits related to basic understanding of hypertension pathophysiology and self care management . Knowledge Deficits related to understanding of medications prescribed for management of hypertension . 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 She states she now has a wrist monitor and has been self monitoring her blood pressure and the readings are high and when she was checked by LTD MD her BP was 200/118 She says transportation to the pharmacy is no longer an issue as she is using [facility] [employer]) but it is difficult for her to get tot he clinic and she cannot afford the clinic copay, she reports good medication taking behavior, she says she is still waiting  on LTD determination and Medicaid determination  Case Manager Clinical Goal(s):  [name] Over the next 30-60 days, patient will verbalize understanding of plan for hypertension management . Over the next 30-60 days, patient  will attend all scheduled medical appointments:  . Over the next 30-60 days, patient will demonstrate improved adherence to prescribed treatment plan for hypertension as evidenced by taking all medications as prescribed, monitoring and recording blood pressure as directed, adhering to low sodium/DASH diet  Interventions:  . Evaluation of current treatment plan related to hypertension self management and patient's adherence to plan as established by provider. . Reviewed medications with patient and assessed medication taking behavior . Reviewed HTN self management strategies including: taking medications as prescribed, smoking cessation, limiting sodium to a teaspoon per day by using lite salt or herbs, rinsing canned vegetables in water prior to heating, avoiding or limiting consumption of  packaged foods/microwave meals and frozen foods with sauces, managing stress and exercise . Positive reinforcement given to her for self monitoring measure her blood pressures . Reviewed home blood pressure readings and reviewed treatment targets for both diastolic and systolic readings . Advised aptient thsi CCM RN will message clinic provider about her elevated BP and her inability to afford clinic copay . Discussed plans with patient for ongoing care management follow up and provided patient with direct contact information for care management team . Reviewed scheduled/upcoming provider appointments including: messaged provider about patient's elevated BP and need for reassessment but patient's inability to afford copay and difficulty getting to clinic for in person appointment  Patient Self Care Activities:  . UNABLE to independently:control blood pressure to meet targets . Self administers medications as prescribed . Attends all scheduled provider appointments . Calls provider office for new concerns, questions, or BP outside discussed parameters . Checks BP and records as discussed . Follows a low sodium  diet/DASH diet     Plan: The care management team will reach out to the patient again over the next 30-60 days.  Kelli Churn RN, CCM, [facility] RN Care Manager [phone]

## 2020-11-03 ENCOUNTER — Other Ambulatory Visit: Payer: Self-pay | Admitting: Student

## 2020-11-03 ENCOUNTER — Other Ambulatory Visit (HOSPITAL_COMMUNITY): Payer: Self-pay

## 2020-11-03 DIAGNOSIS — I10 Essential (primary) hypertension: Secondary | ICD-10-CM

## 2020-11-03 MED ORDER — HYDROCHLOROTHIAZIDE 12.5 MG PO CAPS
25.0000 mg | ORAL_CAPSULE | Freq: Every day | ORAL | 1 refills | Status: DC
  Filled 2020-11-03: qty 60, 30d supply, fill #0

## 2020-11-03 NOTE — Progress Notes (Signed)
Per Marylu Lund, patient's BP is elevated at home. Last OV was 02/2020 and was started on HCTZ 25 mg and Losartan 25 mg. Patient did not return for f/u BMP.  Called and spoke to patient.  She states that she could not come in for an in-person visit due to the inability to afford co-pay.  Patient unfortunately does not have an orange card or CAFA letter.  I advised patient that she does not have to pay a co-pay when come in and we can add the co-pay to her pill.  She will also need to meet with Fredderick Phenix to reapply for orange card.  -Refill HCTZ  -Appointment made by front desk

## 2020-11-10 ENCOUNTER — Encounter: Payer: Self-pay | Admitting: Internal Medicine

## 2020-11-10 ENCOUNTER — Ambulatory Visit (INDEPENDENT_AMBULATORY_CARE_PROVIDER_SITE_OTHER): Payer: Self-pay | Admitting: Internal Medicine

## 2020-11-10 ENCOUNTER — Other Ambulatory Visit (HOSPITAL_COMMUNITY): Payer: Self-pay

## 2020-11-10 DIAGNOSIS — R7303 Prediabetes: Secondary | ICD-10-CM

## 2020-11-10 DIAGNOSIS — F17200 Nicotine dependence, unspecified, uncomplicated: Secondary | ICD-10-CM

## 2020-11-10 DIAGNOSIS — I10 Essential (primary) hypertension: Secondary | ICD-10-CM

## 2020-11-10 DIAGNOSIS — F321 Major depressive disorder, single episode, moderate: Secondary | ICD-10-CM

## 2020-11-10 DIAGNOSIS — F329 Major depressive disorder, single episode, unspecified: Secondary | ICD-10-CM | POA: Insufficient documentation

## 2020-11-10 DIAGNOSIS — E782 Mixed hyperlipidemia: Secondary | ICD-10-CM

## 2020-11-10 DIAGNOSIS — F4320 Adjustment disorder, unspecified: Secondary | ICD-10-CM

## 2020-11-10 DIAGNOSIS — E785 Hyperlipidemia, unspecified: Secondary | ICD-10-CM

## 2020-11-10 LAB — GLUCOSE, CAPILLARY: Glucose-Capillary: 115 mg/dL — ABNORMAL HIGH (ref 70–99)

## 2020-11-10 LAB — POCT GLYCOSYLATED HEMOGLOBIN (HGB A1C): Hemoglobin A1C: 6.1 % — AB (ref 4.0–5.6)

## 2020-11-10 MED ORDER — OLMESARTAN MEDOXOMIL 20 MG PO TABS
20.0000 mg | ORAL_TABLET | Freq: Every day | ORAL | 11 refills | Status: AC
  Filled 2020-11-10: qty 30, 30d supply, fill #0

## 2020-11-10 MED ORDER — BUPROPION HCL ER (SR) 150 MG PO TB12
150.0000 mg | ORAL_TABLET | Freq: Two times a day (BID) | ORAL | 2 refills | Status: AC
  Filled 2020-11-10: qty 60, 30d supply, fill #0

## 2020-11-10 MED ORDER — NICOTINE 7 MG/24HR TD PT24
7.0000 mg | MEDICATED_PATCH | TRANSDERMAL | 0 refills | Status: AC
  Filled 2020-11-10: qty 14, 14d supply, fill #0

## 2020-11-10 MED ORDER — NICOTINE 21 MG/24HR TD PT24
21.0000 mg | MEDICATED_PATCH | TRANSDERMAL | 1 refills | Status: AC
  Filled 2020-11-10: qty 14, 14d supply, fill #0

## 2020-11-10 MED ORDER — ATORVASTATIN CALCIUM 80 MG PO TABS
80.0000 mg | ORAL_TABLET | Freq: Every day | ORAL | 11 refills | Status: AC
  Filled 2020-11-10: qty 30, 30d supply, fill #0

## 2020-11-10 MED ORDER — NICOTINE 14 MG/24HR TD PT24
14.0000 mg | MEDICATED_PATCH | TRANSDERMAL | 0 refills | Status: AC
  Filled 2020-11-10: qty 14, 14d supply, fill #0

## 2020-11-10 MED ORDER — CHLORTHALIDONE 25 MG PO TABS
25.0000 mg | ORAL_TABLET | Freq: Every day | ORAL | 11 refills | Status: AC
  Filled 2020-11-10: qty 30, 30d supply, fill #0

## 2020-11-10 NOTE — Assessment & Plan Note (Addendum)
Lipid panel checked at last visit. Patient has a history of strokes based on exam and history. Patient reports she can afford the medication today and is applying for financial assistance through our clinic.   Assessment/Plan: Mixed hyperlipidemia - atorvastatin (LIPITOR) 80 MG tablet; Take 1 tablet (80 mg total) by mouth daily.  Dispense: 30 tablet; Refill: 11 - Recheck Lipid panel at next visit , target LDL < 70 -CMP

## 2020-11-10 NOTE — Assessment & Plan Note (Addendum)
Lab Results  Component Value Date   HGBA1C 6.1 (A) 11/10/2020   Prediabetic. Referral to RD once patient has been approved for financial assistance.

## 2020-11-10 NOTE — Assessment & Plan Note (Signed)
Patient has smoked approximately 1 ppd x 37 years.  She is interested in quitting and has one box of nicotine patches at home.   Assessment/Plan: Tobacco use disorder. Wellbutrin chosen for cost and patient also has MDD.  - buPROPion (WELLBUTRIN SR) 150 MG 12 hr tablet; Take 1 tablet (150 mg total) by mouth 2 (two) times daily.  Dispense: 60 tablet; Refill: 2 - nicotine (NICODERM CQ - DOSED IN MG/24 HOURS) 14 mg/24hr patch; Place 1 patch (14 mg total) onto the skin daily for 14 days.  Dispense: 14 patch; Refill: 0 - nicotine (NICODERM CQ - DOSED IN MG/24 HOURS) 21 mg/24hr patch; Place 1 patch (21 mg total) onto the skin daily.  Dispense: 14 patch; Refill: 1 - nicotine (NICODERM CQ - DOSED IN MG/24 HR) 7 mg/24hr patch; Place 1 patch (7 mg total) onto the skin daily for 14 days.  Dispense: 14 patch; Refill: 0

## 2020-11-10 NOTE — Assessment & Plan Note (Addendum)
Patient reports she was at outpatient appointment at specialist and noted to have SBP in 200s and had taken her BP medications. She is normotensive today at office visit currently on Losartan 25 mg and HCTZ 25 mg. Given patient has large fluctuations in BP it is possible she could benefit from medications with longer half life. I can not explain her acute changes at this time. I am prescribing Olmesartan and and chlorthalidone . She was informed to stop her current regimen. She will keep a BP log. I have asked her to record her BP every other day and bring this to appointment in 4 weeks. She was also asked to bring her blood pressure meter for calibration against our blood pressure meter.   Assessment/Plan: HTN.  Plan: - chlorthalidone (HYGROTON) 25 MG tablet; Take 1 tablet (25 mg total) by mouth daily.  Dispense: 30 tablet; Refill: 11 - olmesartan (BENICAR) 20 MG tablet; Take 1 tablet (20 mg total) by mouth daily.  Dispense: 30 tablet; Refill: 11 - CMP14 + Anion Gap - follow up in 4 weeks.

## 2020-11-10 NOTE — Patient Instructions (Addendum)
1. MDD  - buPROPion (WELLBUTRIN SR) 150 MG 12 hr tablet; Take 1 tablet (150 mg total) by mouth 2 (two) times daily.  Dispense: 60 tablet; Refill: 2 - Ambulatory referral to Integrated Behavioral Health  2. Mixed hyperlipidemia The 10-year ASCVD risk score Denman George DC Jr., et al., 2013) is: 10.5%   Values used to calculate the score:     Age: 55 years     Sex: Female     Is Non-Hispanic African American: No     Diabetic: No     Tobacco smoker: Yes     Systolic Blood Pressure: 126 mmHg     Is BP treated: Yes     HDL Cholesterol: 43 mg/dL     Total Cholesterol: 283 mg/dL  - atorvastatin (LIPITOR) 80 MG tablet; Take 1 tablet (80 mg total) by mouth daily.  Dispense: 30 tablet; Refill: 11  3. Tobacco use disorder Use the 21 mg patch for 6 weeks. Then the 14 mg patch for 2 weeks. Then the 7 mg patch for 2 weeks.   - buPROPion (WELLBUTRIN SR) 150 MG 12 hr tablet; Take 1 tablet (150 mg total) by mouth 2 (two) times daily.  Dispense: 60 tablet; Refill: 2 - nicotine (NICODERM CQ - DOSED IN MG/24 HOURS) 14 mg/24hr patch; Place 1 patch (14 mg total) onto the skin daily for 14 days.  Dispense: 14 patch; Refill: 0 - nicotine (NICODERM CQ - DOSED IN MG/24 HOURS) 21 mg/24hr patch; Place 1 patch (21 mg total) onto the skin daily for 14 days.  Dispense: 14 patch; Refill: 1 - nicotine (NICODERM CQ - DOSED IN MG/24 HR) 7 mg/24hr patch; Place 1 patch (7 mg total) onto the skin daily for 14 days.  Dispense: 14 patch; Refill: 0  4. Essential hypertension We need you to record your blood pressure every other day until your next appointment. If your blood pressure is consistently high you can make a sooner appointment.  - chlorthalidone (HYGROTON) 25 MG tablet; Take 1 tablet (25 mg total) by mouth daily.  Dispense: 30 tablet; Refill: 11 - olmesartan (BENICAR) 20 MG tablet; Take 1 tablet (20 mg total) by mouth daily.  Dispense: 30 tablet; Refill: 11 - CMP14 + Anion Gap  5. Prediabetes I will make a referral to  the registered dietician when this lab work returns.  - POC Hbg A1C

## 2020-11-10 NOTE — Assessment & Plan Note (Addendum)
  Depression, PHQ-9: Based on the patients  Flowsheet Row Office Visit from 03/23/2020 in [facility]  PHQ-9 Total Score 11      HPI: Score we have suggest mild to moderate depression.  Patient reports her depression began proximately 2 years ago after she had a stroke.  She did not present to a doctor when she had the symptoms of a stroke because she was uninsured. 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  She has noticed Ms. Rissmiller has been depressed as well. The patient has had a depressed mood on most day, lost interest and pleasure in hobbies, increased appetite and low energy.  Assessment/Plan: MDD. 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  Patient is also living with tobacco use disorder. - buPROPion (WELLBUTRIN SR) 150 MG 12 hr tablet; Take 1 tablet (150 mg total) by mouth 2 (two) times daily.  Dispense: 60 tablet; Refill: 2 - Ambulatory referral to Integrated Behavioral Health

## 2020-11-10 NOTE — Progress Notes (Signed)
   CC: MDD, mixed hyperlipidemia, tobacco use disorder, essential hypertension, prediabetes  HPI:[name] is a 55 y.o. female who presents for evaluation of major depressive disorder, mixed hyperlipidemia, tobacco use disorder, essential hypertension, and prediabetes. Please see individual problem based A/P for details.   Past Medical History:  Diagnosis Date  . 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 Blood transfusion 2008   "related to bleeding ulcer?" (01/24/2013)  . BREAST LUMP 08/31/2007   Negative Diagnostic mammo & [place] in 02/2012; Patient to have annual screening mammo.   [name] GERD (gastroesophageal reflux disease)   . Hypertension   . Peptic ulcer disease 11/22/2006   EGD (Dr. Bosie Clos) large, shallow clean-based ulcer at distal stomach. No active bleeding and small hiatal hernia. 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  . Perforated ulcer (HCC) 07/08/2004   thought to be secondary to NSAID use; s/p Cheree Ditto patch repair (Dr. Carolynne Edouard)   Review of Systems:   Review of Systems  Constitutional: Negative for chills and fever.  Neurological: Positive for focal weakness and weakness.  Psychiatric/Behavioral: Positive for depression. Negative for suicidal ideas.     Physical Exam: Vitals:   11/10/20 1539  BP: 126/74  Pulse: 96  Temp: 98 F (36.7 C)  TempSrc: Oral  SpO2: 97%  Weight: 229 lb 3.2 oz (104 kg)    General: Middle age women with central obesity HEENT: Conjunctiva nl , antiicteric sclerae, dorsocervical fat pad Cardiovascular: Normal rate, regular rhythm.  No murmurs, rubs, or gallops Pulmonary : Equal breath sounds, No wheezes, rales, or rhonchi Abdominal:  soft, nontender,  bowel sounds present Ext: No edema in lower extremities, no tenderness to palpation of lower extremities.  Mental Status: Patient is awake, alert, oriented x3 No signs of aphasia or neglect Cranial Nerves: II: Pupils equal, round, and reactive to light.   III,IV, VI: EOMI without ptosis or diploplia.  V: Facial sensation is asymmetric to light touch, numbness on left side of face VII: Facial movement is mildly asymmetric.  VIII: hearing is intact to voice X: Uvula elevates symmetrically XI: Shoulder shrug is symmetric. XII: tongue is midline without atrophy or fasciculations.  Motor: normal effort thorughout, at Least 3/5 LUE, 4/5 Left lower extremity . Contracture of left hand and wrist.  Sensory: Sensation is diminished on left side of ext compared to right to light touch Assessment & Plan:   See Encounters Tab for problem based charting.  Patient discussed with Dr. Cleda Daub

## 2020-11-11 ENCOUNTER — Encounter: Payer: Self-pay | Admitting: Internal Medicine

## 2020-11-11 LAB — CMP14 + ANION GAP
ALT: 17 IU/L (ref 0–32)
AST: 15 IU/L (ref 0–40)
Albumin/Globulin Ratio: 1.6 (ref 1.2–2.2)
Albumin: 4.4 g/dL (ref 3.8–4.9)
Alkaline Phosphatase: 117 IU/L (ref 44–121)
Anion Gap: 16 mmol/L (ref 10.0–18.0)
BUN/Creatinine Ratio: 27 — ABNORMAL HIGH (ref 9–23)
BUN: 17 mg/dL (ref 6–24)
Bilirubin Total: <0.2 mg/dL (ref 0.0–1.2)
CO2: 27 mmol/L (ref 20–29)
Calcium: 9.9 mg/dL (ref 8.7–10.2)
Chloride: 98 mmol/L (ref 96–106)
Creatinine, Ser: 0.63 mg/dL (ref 0.57–1.00)
Globulin, Total: 2.7 g/dL (ref 1.5–4.5)
Glucose: 102 mg/dL — ABNORMAL HIGH (ref 65–99)
Potassium: 5 mmol/L (ref 3.5–5.2)
Sodium: 141 mmol/L (ref 134–144)
Total Protein: 7.1 g/dL (ref 6.0–8.5)
eGFR: 106 mL/min/{1.73_m2} (ref >59)

## 2020-11-11 NOTE — Assessment & Plan Note (Addendum)
Weight /BMI 11/10/2020 03/23/2020 02/04/2013 01/28/2013  WEIGHT 229 lb 3.2 oz 210 lb 9.6 oz 163 lb 6.4 oz 160 lb 0.9 oz   Weight /BMI 01/24/2013 01/24/2013 01/24/2013 05/16/2012  WEIGHT  156 lb 14.4 oz  161 lb 3.2 oz   Weight /BMI 03/30/2012 02/29/2012 02/16/2012 08/31/2007 08/16/2007  WEIGHT 154 lb 1.6 oz 156 lb 4.8 oz 153 lb 9.6 oz 148 lb 147 lb 1 oz   Weight /BMI 07/23/2007 07/16/2007 06/27/2007 02/28/2007  WEIGHT 146 lb 6.4 oz 143 lb 1 oz 144 lb 12.8 oz 129 lb 8 oz   Weight /BMI 11/28/2006  WEIGHT 117 lb 1.6 oz    Patient has had extreme weight gain in the last 8 years. She has gained 20 lbs in the last year. Patient also has HTN. Given central obesity, dorsocervical fat pad, polycythemia, and new depression I have concern for cushing syndrome. Polycythemia is better explained by her history of tobacco use disorder. 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 Patient does report a diet of mostly processed foods and overeating. Patient is uninsured and further evaluation for suspicion is not possible with financial restraints/warranted until other treatment has failed.  Currently working on orange card.

## 2020-11-13 NOTE — Progress Notes (Signed)
Internal Medicine Clinic Attending  Case discussed with Dr. Steen  At the time of the visit.  We reviewed the resident's history and exam and pertinent patient test results.  I agree with the assessment, diagnosis, and plan of care documented in the resident's note.  

## 2020-11-17 ENCOUNTER — Encounter: Payer: Self-pay | Admitting: Internal Medicine

## 2020-11-30 ENCOUNTER — Other Ambulatory Visit: Payer: Self-pay

## 2020-11-30 ENCOUNTER — Ambulatory Visit: Payer: Self-pay | Admitting: Behavioral Health

## 2020-11-30 DIAGNOSIS — F331 Major depressive disorder, recurrent, moderate: Secondary | ICD-10-CM

## 2020-11-30 DIAGNOSIS — F419 Anxiety disorder, unspecified: Secondary | ICD-10-CM

## 2020-11-30 NOTE — BH Specialist Note (Signed)
Integrated Behavioral Health via Telemedicine Visit  11/30/2020 [name] [id number]  Number of Integrated Behavioral Health visits: 1/6 Session Start time: 9:30am  Session End time: 9:45am Total time: 15  Referring Provider: Dr. Albertha Ghee, MD Patient/Family location: Pt is w/her MIL in [place], [place]; they have transported her Husb to [facility] as he suffered a stroke over the wknd [facility] Provider location: [facility] All persons participating in visit: Pt & Clinician & MIL Types of Service: Family psychotherapy  I connected with [name] and/or [name]'s self via  Telephone or [employer]  (Video is Caregility application) and verified that I am speaking with the correct person using two identifiers. Discussed confidentiality: Identitified myself & provided general & brief support to Pt & her MIL  I discussed the limitations of telemedicine and the availability of in person appointments.  Discussed there is a possibility of technology failure and discussed alternative modes of communication if that failure occurs.  I discussed that engaging in this telemedicine visit, they consent to the provision of behavioral healthcare and the services will be billed under their insurance.  Patient and/or legal guardian expressed understanding and consented to Telemedicine visit: n/a  Presenting Concerns: Patient and/or family reports the following symptoms/concerns: Pt & Husb transported by In Laws to their home in [place], [place] so Husb can get Tx for recent stroke event over the wknd Duration of problem: almost a week; Severity of problem: moderate to severe-unk to Clinician @ this time  Patient and/or Family's Strengths/Protective Factors: Concrete supports in place (healthy food, safe environments, etc.), Sense of purpose and Physical Health (exercise, healthy diet, medication compliance, etc.)  Goals Addressed: Patient will: 1.   Reduce symptoms of: anxiety, depression and stress  2.  Increase knowledge and/or ability of: coping skills and stress reduction  3.  Demonstrate ability to: Increase healthy adjustment to current life circumstances  Progress towards Goals: Estb'd tentatively today; ck-in visit in 2-3 wks per Pt guesstimate about Husb's health status  Interventions: Interventions utilized:  Intro & moral support for Husb's health status Standardized Assessments completed: screeners prn  Patient and/or Family Response: Pt receptive & grateful for call today as she is dealing w/her Husb's recent stroke event over the wknd. She requests a ck-in appt in a few wks  Assessment: Patient currently experiencing elevated anx/stress due to Husb's recent stroke & the Cpl's lack of transportation. They have waited on Family to transport them to [facility], [place] where they have support.  Patient may benefit from supportive ck-in call in a few wks. Pt aware of IBH Clinician availability.  Plan: 1. Follow up with behavioral health clinician on : 2-3 wk ck-in on Cpl's health status 2. Behavioral recommendations: Lean on Family as Mother is very supportive of her Son, Wife & their current needs.  3. Referral(s): Integrated [employer] (In Clinic)  I discussed the assessment and treatment plan with the patient and/or parent/guardian. They were provided an opportunity to ask questions and all were answered. They agreed with the plan and demonstrated an understanding of the instructions.   They were advised to call back or seek an in-person evaluation if the symptoms worsen or if the condition fails to improve as anticipated.  Deneise Lever, LMFT

## 2020-12-03 ENCOUNTER — Ambulatory Visit: Payer: Self-pay

## 2020-12-03 NOTE — Patient Instructions (Signed)
Visit Information   Goals Addressed   None     The patient verbalized understanding of instructions, educational materials, and care plan provided today and declined offer to receive copy of patient instructions, educational materials, and care plan.   The patient has been provided with contact information for the care management team and has been advised to call with any health related questions or concerns.   Jodelle Gross, RN, BSN, CCM Care Management Coordinator [facility] Internal Medicine Phone: [phone] / Fax: [phone]

## 2020-12-03 NOTE — Chronic Care Management (AMB) (Signed)
Care Management    RN Visit Note  12/03/2020 Name: [name] MRN: [id number] DOB: 1966/04/19  Subjective: [name] is a 55 y.o. year old female who is a primary care patient of [name], [name], MD. The care management team was consulted for assistance with disease management and care coordination needs.    Engaged with patient by telephone for follow up visit in response to provider referral for case management and/or care coordination services.   Consent to Services:   [name] was given information about Care Management services today including:  Care Management services includes personalized support from designated clinical staff supervised by her physician, including individualized plan of care and coordination with other care providers 24/7 contact phone numbers for assistance for urgent and routine care needs. The patient may stop case management services at any time by phone call to the office staff.  Patient agreed to services and consent obtained.    Assessment:  Patient is transferring care due to move . See Care Plan below for interventions and patient self-care actives. Follow up Plan: RNCM will remain available for 14 days.  If no further needs are assessed at this time RNCM will be removed from care team.  Review of patient past medical history, allergies, medications, health status, including review of consultants reports, laboratory and other test data, was performed as part of comprehensive evaluation and provision of chronic care management services.   SDOH (Social Determinants of Health) assessments and interventions performed:    Care Plan  No Known Allergies  Outpatient Encounter Medications as of 12/03/2020  Medication Sig   atorvastatin (LIPITOR) 80 MG tablet Take 1 tablet (80 mg total) by mouth daily.   buPROPion (WELLBUTRIN SR) 150 MG 12 hr tablet Take 1 tablet (150 mg total) by mouth 2 (two) times daily.   chlorthalidone (HYGROTON) 25 MG tablet  Take 1 tablet (25 mg total) by mouth daily.   nicotine (NICODERM CQ - DOSED IN MG/24 HOURS) 14 mg/24hr patch Place 1 patch (14 mg total) onto the skin daily for 14 days.   nicotine (NICODERM CQ - DOSED IN MG/24 HOURS) 21 mg/24hr patch Place 1 patch (21 mg total) onto the skin daily.   [START ON 12/08/2020] nicotine (NICODERM CQ - DOSED IN MG/24 HR) 7 mg/24hr patch Place 1 patch (7 mg total) onto the skin daily for 14 days.   olmesartan (BENICAR) 20 MG tablet Take 1 tablet (20 mg total) by mouth daily.   No facility-administered encounter medications on file as of 12/03/2020.    Patient Active Problem List   Diagnosis Date Noted   Morbid obesity with body mass index of 40.0-49.9 ([place]) 11/11/2020   MDD (major depressive disorder) 11/10/2020   Tobacco use disorder 11/10/2020   History of stroke 03/24/2020   Prediabetes 03/24/2020   Polycythemia 03/24/2020   Financial difficulties 03/24/2020   Dyspnea on exertion 03/24/2020   Urge incontinence 03/24/2020   Hyperlipidemia with target LDL less than 70 03/24/2020   Protein-calorie malnutrition, severe ([place]) 01/25/2013   Essential hypertension 07/23/2007    Conditions to be addressed/monitored: HTN and Medicaid application/Disability  Care Plan : CCM RN- Hypertension (Adult)  Updates made by Johnney Killian, RN since 12/03/2020 12:00 AM     Problem: Hypertension (Hypertension)   Priority: High     Goal: Hypertension Monitored   Start Date: 04/06/2020  Recent Progress: On track  Priority: High  Note:   CARE PLAN ENTRY (see longitudinal plan of care for additional  care plan information)  Objective:  Last practice recorded BP readings:  BP Readings from Last 3 Encounters:  03/23/20 (!) 162/103  02/04/13 121/83  01/28/13 127/71  Recent office visit note shows BP on 11/10/2020 in office was 126/74 Most recent eGFR/CrCl: No results found for: EGFR  No components found for: CRCL  Current Barriers:  Knowledge Deficits related to basic  understanding of hypertension pathophysiology and self care management Knowledge Deficits related to understanding of medications prescribed for management of hypertension  Case Manager Clinical Goal(s):  Over the next 30-60 days, patient will verbalize understanding of plan for hypertension management Over the next 30-60 days, patient will attend all scheduled medical appointments:  Over the next 30-60 days, patient will demonstrate improved adherence to prescribed treatment plan for hypertension as evidenced by taking all medications as prescribed, monitoring and recording blood pressure as directed, adhering to low sodium/DASH diet  Interventions:  Evaluation of current treatment plan related to hypertension self management and patient's adherence to plan as established by provider. Reviewed medications with patient and assessed medication taking behavior- Patient states that her husband is in the hospital and that they have permanently moved to [facility], [place] to live with her family. Patient will be finding a new doctor in [facility], as she is not returning to [place].  Reviewed medication questions related to taking her meds to a local pharmacy in [facility], would they be able to call her [place] to transfer the prescriptions.  Explained that her new pharmacy should be able to get prescriptions transferred if she has refills, which she does.  Advised to contact clinic if she has any issues. Positive reinforcement given to her for self monitoring measure her blood pressures Discussed plans with patient for ongoing care management follow up and provided patient with direct contact information for care management team Advised patient to continue to take her blood pressure and to contact the clinic if she needs any assistance finding a provider in [place].  Patient Self Care Activities:  UNABLE to independently:control blood pressure to meet targets Self administers  medications as prescribed Attends all scheduled provider appointments Calls provider office for new concerns, questions, or BP outside discussed parameters Checks BP and records as discussed Follows a low sodium diet/DASH diet    Care Plan : CCM RN- Wellness (Adult)  Updates made by Johnney Killian, RN since 12/03/2020 12:00 AM     Problem: Barriers to Treatment      Goal: Barriers to Treatment Identified and Managed- Patient has no health insurance or income , social security disability and Medicaid pending   Start Date: 07/10/2020  Expected End Date: 12/24/2020  Recent Progress: On track  Priority: High  Note:   Current Barriers:  Care Coordination needs related to lack of health insurance in a patient with 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  Patient remains waiting on decision on Medicaid and she has a family member assisting her.  Nurse Case Manager Clinical Goal(s):  Over the next 30-90 days, patient will work with community care guide to address needs related to applying for Medicaid - Patient moved to [facility], [place] and is transferring her care.  Advised to contact office if she needs  any assistance transferring her care.  Interventions:  Inter-disciplinary care team collaboration (see longitudinal plan of care) Assessed status of Medicaid application Assessed status of long term disability application  Patient Goals/Self-Care Activities Over the next 30-60 days, patient will:  - Patient will work with CCM BSW to address care coordination needs related to applying for Medicaid   Follow Up Plan:  Patient to contact clinic/community care guide if she needs assistance transferring care or  finding a new provider.         Plan: The patient has been provided with contact information for the care management team and has been advised to call with any health related questions or concerns.   Johnney Killian, RN, BSN, CCM Care Management Coordinator [facility] Internal Medicine Phone: [phone] / Fax: [phone]

## 2020-12-07 ENCOUNTER — Other Ambulatory Visit (HOSPITAL_COMMUNITY): Payer: Self-pay

## 2020-12-14 ENCOUNTER — Encounter: Payer: Self-pay | Admitting: Internal Medicine

## 2020-12-14 ENCOUNTER — Telehealth: Payer: Self-pay

## 2020-12-21 ENCOUNTER — Ambulatory Visit: Payer: Self-pay | Admitting: Behavioral Health

## 2020-12-29 ENCOUNTER — Encounter: Payer: Self-pay | Admitting: *Deleted

## 2021-01-08 LAB — PULMONARY FUNCTION TEST
